# Patient Record
Sex: Female | Born: 1960 | Race: White | Hispanic: No | Smoking: Never smoker
Health system: Southern US, Community
[De-identification: ages and names within clinical notes are randomized; demographics above are authoritative.]

## PROBLEM LIST (undated history)

## (undated) DIAGNOSIS — F32A Depression, unspecified: Secondary | ICD-10-CM

## (undated) DIAGNOSIS — E7211 Homocystinuria: Secondary | ICD-10-CM

## (undated) DIAGNOSIS — T8859XA Other complications of anesthesia, initial encounter: Secondary | ICD-10-CM

## (undated) DIAGNOSIS — E785 Hyperlipidemia, unspecified: Secondary | ICD-10-CM

## (undated) DIAGNOSIS — F329 Major depressive disorder, single episode, unspecified: Secondary | ICD-10-CM

## (undated) DIAGNOSIS — F419 Anxiety disorder, unspecified: Secondary | ICD-10-CM

## (undated) DIAGNOSIS — I10 Essential (primary) hypertension: Secondary | ICD-10-CM

## (undated) DIAGNOSIS — K219 Gastro-esophageal reflux disease without esophagitis: Secondary | ICD-10-CM

## (undated) HISTORY — PX: MENISCUS REPAIR: SHX5179

## (undated) HISTORY — PX: REDUCTION MAMMAPLASTY: SUR839

## (undated) HISTORY — DX: Homocystinuria: E72.11

## (undated) HISTORY — DX: Hyperlipidemia, unspecified: E78.5

## (undated) HISTORY — DX: Major depressive disorder, single episode, unspecified: F32.9

## (undated) HISTORY — DX: Depression, unspecified: F32.A

## (undated) HISTORY — PX: CARPAL TUNNEL RELEASE: SHX101

---

## 1998-05-11 ENCOUNTER — Other Ambulatory Visit: Admission: RE | Admit: 1998-05-11 | Discharge: 1998-05-11 | Payer: Self-pay | Admitting: Obstetrics and Gynecology

## 1998-08-31 ENCOUNTER — Ambulatory Visit (HOSPITAL_COMMUNITY): Admission: RE | Admit: 1998-08-31 | Discharge: 1998-08-31 | Payer: Self-pay | Admitting: Obstetrics and Gynecology

## 1998-08-31 ENCOUNTER — Encounter: Payer: Self-pay | Admitting: Obstetrics and Gynecology

## 1999-01-16 ENCOUNTER — Ambulatory Visit (HOSPITAL_BASED_OUTPATIENT_CLINIC_OR_DEPARTMENT_OTHER): Admission: RE | Admit: 1999-01-16 | Discharge: 1999-01-16 | Payer: Self-pay | Admitting: Orthopedic Surgery

## 1999-05-30 ENCOUNTER — Other Ambulatory Visit: Admission: RE | Admit: 1999-05-30 | Discharge: 1999-05-30 | Payer: Self-pay | Admitting: Obstetrics and Gynecology

## 2000-06-27 ENCOUNTER — Other Ambulatory Visit: Admission: RE | Admit: 2000-06-27 | Discharge: 2000-06-27 | Payer: Self-pay | Admitting: Obstetrics and Gynecology

## 2001-07-17 ENCOUNTER — Other Ambulatory Visit: Admission: RE | Admit: 2001-07-17 | Discharge: 2001-07-17 | Payer: Self-pay | Admitting: Obstetrics and Gynecology

## 2001-10-23 ENCOUNTER — Encounter: Payer: Self-pay | Admitting: Obstetrics and Gynecology

## 2001-10-23 ENCOUNTER — Encounter: Admission: RE | Admit: 2001-10-23 | Discharge: 2001-10-23 | Payer: Self-pay | Admitting: Obstetrics and Gynecology

## 2002-08-18 ENCOUNTER — Other Ambulatory Visit: Admission: RE | Admit: 2002-08-18 | Discharge: 2002-08-18 | Payer: Self-pay | Admitting: Obstetrics and Gynecology

## 2003-09-06 ENCOUNTER — Inpatient Hospital Stay (HOSPITAL_COMMUNITY): Admission: EM | Admit: 2003-09-06 | Discharge: 2003-09-12 | Payer: Self-pay | Admitting: Psychiatry

## 2003-09-13 ENCOUNTER — Other Ambulatory Visit (HOSPITAL_COMMUNITY): Admission: RE | Admit: 2003-09-13 | Discharge: 2003-09-20 | Payer: Self-pay | Admitting: Psychiatry

## 2003-09-22 ENCOUNTER — Other Ambulatory Visit: Admission: RE | Admit: 2003-09-22 | Discharge: 2003-09-22 | Payer: Self-pay | Admitting: Obstetrics and Gynecology

## 2003-09-23 ENCOUNTER — Inpatient Hospital Stay (HOSPITAL_COMMUNITY): Admission: EM | Admit: 2003-09-23 | Discharge: 2003-10-02 | Payer: Self-pay | Admitting: Psychiatry

## 2005-02-23 ENCOUNTER — Other Ambulatory Visit: Admission: RE | Admit: 2005-02-23 | Discharge: 2005-02-23 | Payer: Self-pay | Admitting: Obstetrics and Gynecology

## 2005-03-07 ENCOUNTER — Ambulatory Visit: Payer: Self-pay | Admitting: Internal Medicine

## 2005-08-15 ENCOUNTER — Other Ambulatory Visit: Admission: RE | Admit: 2005-08-15 | Discharge: 2005-08-15 | Payer: Self-pay | Admitting: Obstetrics and Gynecology

## 2007-03-06 ENCOUNTER — Ambulatory Visit: Payer: Self-pay | Admitting: Internal Medicine

## 2007-03-07 ENCOUNTER — Ambulatory Visit: Payer: Self-pay | Admitting: Internal Medicine

## 2007-03-07 LAB — CONVERTED CEMR LAB
ALT: 26 units/L (ref 0–40)
AST: 24 units/L (ref 0–37)
Albumin: 3.8 g/dL (ref 3.5–5.2)
Alkaline Phosphatase: 37 units/L — ABNORMAL LOW (ref 39–117)
BUN: 10 mg/dL (ref 6–23)
Basophils Absolute: 0 10*3/uL (ref 0.0–0.1)
Basophils Relative: 0.1 % (ref 0.0–1.0)
Bilirubin, Direct: 0.1 mg/dL (ref 0.0–0.3)
CO2: 27 meq/L (ref 19–32)
CRP, High Sensitivity: 1 — ABNORMAL HIGH
Calcium: 8.7 mg/dL (ref 8.4–10.5)
Chloride: 108 meq/L (ref 96–112)
Cholesterol: 259 mg/dL (ref 0–200)
Creatinine, Ser: 1 mg/dL (ref 0.4–1.2)
Direct LDL: 157.2 mg/dL
Eosinophils Absolute: 0.5 10*3/uL (ref 0.0–0.6)
Eosinophils Relative: 7.8 % — ABNORMAL HIGH (ref 0.0–5.0)
Free T4: 0.7 ng/dL (ref 0.6–1.6)
GFR calc Af Amer: 78 mL/min
GFR calc non Af Amer: 64 mL/min
Glucose, Bld: 99 mg/dL (ref 70–99)
HCT: 39 % (ref 36.0–46.0)
HDL: 59.5 mg/dL (ref >39.0)
Hemoglobin: 13.8 g/dL (ref 12.0–15.0)
Lymphocytes Relative: 14.8 % (ref 12.0–46.0)
MCHC: 35.3 g/dL (ref 30.0–36.0)
MCV: 92 fL (ref 78.0–100.0)
Monocytes Absolute: 0.5 10*3/uL (ref 0.2–0.7)
Monocytes Relative: 7.3 % (ref 3.0–11.0)
Neutro Abs: 5 10*3/uL (ref 1.4–7.7)
Neutrophils Relative %: 70 % (ref 43.0–77.0)
Platelets: 260 10*3/uL (ref 150–400)
Potassium: 4.1 meq/L (ref 3.5–5.1)
RBC: 4.24 M/uL (ref 3.87–5.11)
RDW: 12.4 % (ref 11.5–14.6)
Sodium: 141 meq/L (ref 135–145)
TSH: 3.16 microintl units/mL (ref 0.35–5.50)
Total Bilirubin: 0.9 mg/dL (ref 0.3–1.2)
Total CHOL/HDL Ratio: 4.4
Total Protein: 6.6 g/dL (ref 6.0–8.3)
Triglycerides: 123 mg/dL (ref 0–149)
VLDL: 25 mg/dL (ref 0–40)
WBC: 7 10*3/uL (ref 4.5–10.5)

## 2007-03-18 ENCOUNTER — Ambulatory Visit: Payer: Self-pay | Admitting: Internal Medicine

## 2007-05-07 ENCOUNTER — Encounter: Payer: Self-pay | Admitting: Internal Medicine

## 2007-05-30 HISTORY — PX: OTHER SURGICAL HISTORY: SHX169

## 2007-06-04 ENCOUNTER — Encounter: Payer: Self-pay | Admitting: Internal Medicine

## 2007-07-17 ENCOUNTER — Encounter: Payer: Self-pay | Admitting: Internal Medicine

## 2007-07-17 HISTORY — PX: OTHER SURGICAL HISTORY: SHX169

## 2008-03-23 ENCOUNTER — Emergency Department (HOSPITAL_COMMUNITY): Admission: EM | Admit: 2008-03-23 | Discharge: 2008-03-23 | Payer: Self-pay | Admitting: Emergency Medicine

## 2008-06-28 ENCOUNTER — Encounter: Payer: Self-pay | Admitting: Internal Medicine

## 2009-08-24 ENCOUNTER — Encounter (INDEPENDENT_AMBULATORY_CARE_PROVIDER_SITE_OTHER): Payer: Self-pay | Admitting: *Deleted

## 2009-10-17 ENCOUNTER — Encounter: Payer: Self-pay | Admitting: Internal Medicine

## 2009-10-18 ENCOUNTER — Encounter: Payer: Self-pay | Admitting: *Deleted

## 2010-02-08 ENCOUNTER — Encounter: Payer: Self-pay | Admitting: Internal Medicine

## 2010-03-21 ENCOUNTER — Telehealth: Payer: Self-pay | Admitting: *Deleted

## 2010-03-24 ENCOUNTER — Ambulatory Visit: Payer: Self-pay | Admitting: Internal Medicine

## 2010-11-16 NOTE — Progress Notes (Signed)
Summary: last tetanus shot  Phone Note Call from Patient Call back at [phone]   Caller: Patient-----live call Reason for Call: Talk to Nurse Summary of Call: when was her last tetanus shot? she thought it was 3 years ago. she is going on a mission trip. Initial call taken by: Warnell Forester,  March 21, 2010 3:05 PM  Follow-up for Phone Call        No record of last td shot in EMR or IDX. Pt aware and is going to come in on friday for inj. Follow-up by: Romualdo Bolk, CMA (AAMA),  March 21, 2010 3:18 PM

## 2010-11-16 NOTE — Letter (Signed)
Summary: [facility]  [facility]   Imported By: Maryln Gottron 02/21/2010 09:29:12  _____________________________________________________________________  External Attachment:    Type:   Image     Comment:   External Document

## 2010-11-16 NOTE — Miscellaneous (Signed)
Summary: Immunization Entry   Immunization History:  Influenza Immunization History:    Influenza:  historical (10/17/2009)

## 2010-11-16 NOTE — Miscellaneous (Signed)
Summary: Flu Vaccine/[facility]  Flu Vaccine/[facility]   Imported By: Maryln Gottron 10/21/2009 09:39:04  _____________________________________________________________________  External Attachment:    Type:   Image     Comment:   External Document

## 2010-11-16 NOTE — Assessment & Plan Note (Signed)
Summary: tdap/ssc  Nurse Visit   Immunizations Administered:  Tetanus Vaccine:    Vaccine Type: Tdap    Site: right deltoid    Mfr: GlaxoSmithKline    Dose: 0.5 ml    Route: IM    Given by: Romualdo Bolk, CMA (AAMA)    Exp. Date: 01/07/2012    Lot #: [id number]  Orders Added: 1)  Tdap => 57yrs IM [90715] 2)  Admin 1st Vaccine [90471]

## 2011-02-12 ENCOUNTER — Telehealth: Payer: Self-pay | Admitting: *Deleted

## 2011-02-12 NOTE — Telephone Encounter (Signed)
Pt is thinking that she has the stomach flu. She is starting to get over the diarrhea and vomiting. Pt has still got a temp 101 around 2:45pm. Pt is taking Advil and wanted some advice about what to take. She started this around 5pm on 4/29. I told pt to drink plenty of fluids, keep hydrated and take tylenol or Advil as directed. Call us if she is not better in a few days.

## 2011-03-02 NOTE — Discharge Summary (Signed)
NAME:  Shannon Gallagher, SHEFFER NO.:  [id number]   MEDICAL RECORD NO.:  [id number]                   PATIENT TYPE:  IPS   LOCATION:  0404                                 [facility]ILITY[facility]   PHYSICIAN:  Jeanice Lim, M.D.              DATE OF BIRTH:  09-24-1961   DATE OF ADMISSION:  09/06/2003  DATE OF DISCHARGE:  09/12/2003                                 DISCHARGE SUMMARY   IDENTIFYING DATA:  This is a 50 year old married Caucasian female  voluntarily admitted with a history of possible OCD versus severe  depression.  The patient apparently had some anxiety, obsessive thinking and  depressive symptoms, which had been mild to moderate for over a year and had  been somewhat isolative but, over the last two weeks, had become more  tearful, more withdrawn, staying in bed, not functioning with thought-  blocking and delusional thoughts.  Apparently, this was associated with  having requested Paxil due to anxiety and depressive symptoms and the  patient became more agitated, stopped sleeping completely.  Had been only  sleeping only 2-3 hours prior and then had been given Wellbutrin from an  OB/GYN doctor and Paxil was stopped and symptoms even worsened.  The patient  had been treated in the past for postpartum depression 7-8 years ago with  Zoloft with a positive response and no side effects.   FAMILY HISTORY:  Mother has a history of OCD and hoarding.   MEDICATIONS:  The patient had taken Paxil for three days and took Wellbutrin  for five days.  Had been on no medications for several years.   PHYSICIAN:  Dr. Vergia Alberts is her physician.   ALLERGIES:  No known drug allergies.   PHYSICAL EXAMINATION:  Essentially within normal limits.  Neurologically  nonfocal.   LABORATORY DATA:  Routine admission labs within normal limits.   MENTAL STATUS EXAM:  Sedated.  Opens eyes when stimulated but unable to  participate in interview.  Unable to assess.  Thought  processes goal  directed.  Thought content negative for dangerous ideation or psychotic  symptoms.  The patient had obsessive ruminations, feeling that she had hurt  her family, that she did not love her family, that she was cold-hearted,  that she was living a lie, that she was financial destroying her family,  using up their insurance, unrealistic, depressive, egosyntonic-related  symptoms as part of a psychotic depression with an obsessive-compulsive  nature.  Thought-blocking at times.  Complaining of difficulty sleeping.  Decreased appetite.  Quite anxious and agitated at times due to distressing  reoccurring thoughts.   ADMISSION DIAGNOSES:   AXIS I:  1. Major depression, severe, recurrent with psychotic symptoms.  2. Obsessive-compulsive disorder.  3. Rule out secondary medication-induced anxiety and mood disorder.   AXIS II:  Deferred.   AXIS III:  History of allergies not otherwise specified.   AXIS IV:  Grief secondary to  divorce of parents and problems with primary  support group.   AXIS V:  23/75.   HOSPITAL COURSE:  The patient was admitted and ordered routine p.r.n.  medications and underwent further monitoring.  Was encouraged to participate  in individual, group and milieu therapy.  The patient was initially given  Zyprexa and Ativan.  EKG was checked due to patient complaint of anxiety and  Xanax ordered p.r.n. for acute anxiety.  The patient reported a positive  response to these medications, was quite distressed about being in the  hospital.  However, her thinking was not rational and communication with  husband, directly from physician as well as meeting with patient and husband  to increased the patient's insight, decrease her distress and to educate  family regarding the importance of treating this condition and the challenge  of treating this condition due to patient irrational thinking, was explained  clearly and patient and husband were comfortable with  medications chosen  after discussing risk/benefit ratio and alternative treatments.  The patient  and family felt patient was distressed in the hospital and that she would do  better in a more supportive setting with 24-hour monitoring and would be  willing to participate in intensive outpatient treatment.  The patient  tolerated medications well.  There was a decrease in anxiety and severity of  depressive symptoms.  There was no dangerous ideation.  However, the patient  still had mild distortions of thinking, obsessive-compulsive nature, again  egosyntonic.  The patient was tolerating medications and discharged to  follow up with IOP with 24-hour family support for 1-2 weeks until  reevaluation by outpatient psychiatrist.   CONDITION ON DISCHARGE:  Improved.  The patient was less anxious, less  severely depressed, in no acute distress.  However still having some thought  distortions, obsessive thoughts and was clearly depressed and anxious.  She  was sleeping and eating well.   DISCHARGE MEDICATIONS:  1. Ativan 0.5 mg twice a day and 1 mg q.h.s.  2. Ambien 10 mg q.h.s.  3. Zoloft 50 mg daily.  4. Risperdal M-Tab 1 mg q.h.s. and 0.5 mg q.h.s. totalling 1.5 mg q.h.s.   FOLLOW UP:  The patient was to follow up in intensive outpatient the  following morning that the IOP was open.   The patient was discharged to family supervision and intensive outpatient  follow-up.   DISCHARGE DIAGNOSES:   AXIS I:  1. Major depression, severe, recurrent with psychotic symptoms.  2. Obsessive-compulsive disorder.  3. Rule out secondary medication-induced anxiety and mood disorder.   AXIS II:  Deferred.   AXIS III:  History of allergies not otherwise specified.   AXIS IV:  Grief secondary to divorce of parents and problems with primary  support group.   AXIS V:  Global Assessment of Functioning on discharge 50.                                               Jeanice Lim,  M.D.   JEM/MEDQ  D:  10/03/2003  T:  10/04/2003  Job:  [id number]

## 2011-03-02 NOTE — Discharge Summary (Signed)
NAME:  Shannon Gallagher, Shannon Gallagher NO.:  [id number]   MEDICAL RECORD NO.:  [id number]                   PATIENT TYPE:  IPS   LOCATION:  0305                                 [facility]ACILITY:  [facility]   PHYSICIAN:  Jeanice Lim, M.D.              DATE OF BIRTH:  20-Nov-1960   DATE OF ADMISSION:  09/23/2003  DATE OF DISCHARGE:  10/02/2003                                 DISCHARGE SUMMARY   IDENTIFYING DATA:  This is a 50 year old married Caucasian female  voluntarily admitted on September 23, 2003.  Presented with a history of  depression and suicidal ideation for two days.  Feeling overwhelmed,  withdrawn.  Decreased sleep and appetite.   MEDICATIONS:  Trazodone 50 mg, Zoloft 50 mg and Risperdal.   ALLERGIES:  No known drug allergies.   PHYSICAL EXAMINATION:  Essentially within normal limits.  Neurologically  nonfocal.   LABORATORY DATA:  Routine admission labs essentially within normal limits.   MENTAL STATUS EXAM:  Alert, middle-aged female.  Casually dressed.  Speech  clear.  Mood tired, depressed.  Affect flat.  Thought processes goal  directed.  Thought content negative for dangerous ideation or psychotic  symptoms.  Judgment and insight were fair.   ADMISSION DIAGNOSES:   AXIS I:  Major depressive disorder, recurrent, severe.   AXIS II:  Deferred.   AXIS III:  None.   AXIS IV:  Moderate (problems with primary support group).   AXIS V:  30/60.   HOSPITAL COURSE:  The patient was admitted and ordered routine p.r.n.  medications and underwent further monitoring.  Was encouraged to participate  in individual, group and milieu therapy.  The patient reported feeling  hopeless, helpless, severely depressed.  Reported having difficulty because  her husband had been out of town.  Tearful, sad, depressed.  Outpatient  psychiatrist saw her as an inpatient and medications were adjusted.  The  patient reported some delusional thinking, suicidal thoughts, anxiety.   She  was optimized on Risperdal, Zoloft and trazodone to restore sleep, target  depressive symptoms and psychotic symptoms.  Reality testing, mood and sleep  all improved as she was stabilized on medications.   CONDITION ON DISCHARGE:  She was discharged in improved condition without  psychotic symptoms, dangerous ideation, reporting motivation to be compliant  with the follow-up plan.   DISCHARGE MEDICATIONS:  1. Zoloft 100 mg in the morning.  2. Zoloft 75 mg q.h.s.  3. Ativan 0.5 mg, 1 in the morning, 1/2 at 1 p.m., 1 at 6 p.m. and 2 q.h.s.  4. Imipramine 100 mg, 2 q.h.s.  5. Risperdal 2 mg q.h.s.  6. Risperdal 0.25 mg, 1/2 at 1 p.m.  7. Cogentin 1 mg twice a day as needed for muscle spasms.   FOLLOW UP:  The patient is to follow up with [facility] starting on October 04, 2003 at 8:30 and then follow  up with Tiana Loft and Milford Cage.   DISCHARGE DIAGNOSES:   AXIS I:  Major depressive disorder, recurrent, severe.   AXIS II:  Deferred.   AXIS III:  None.   AXIS IV:  Moderate (problems with primary support group).   AXIS V:  Global Assessment of Functioning on discharge 55.                                               Jeanice Lim, M.D.    JEM/MEDQ  D:  10/27/2003  T:  10/28/2003  Job:  [id number]

## 2013-01-16 ENCOUNTER — Encounter: Payer: Self-pay | Admitting: Physician Assistant

## 2013-01-21 ENCOUNTER — Encounter: Payer: Self-pay | Admitting: Internal Medicine

## 2013-04-03 ENCOUNTER — Ambulatory Visit: Payer: Self-pay | Admitting: Internal Medicine

## 2013-07-22 ENCOUNTER — Telehealth: Payer: Self-pay | Admitting: Internal Medicine

## 2013-07-22 DIAGNOSIS — E782 Mixed hyperlipidemia: Secondary | ICD-10-CM

## 2013-07-22 DIAGNOSIS — Z79899 Other long term (current) drug therapy: Secondary | ICD-10-CM

## 2013-07-22 NOTE — Telephone Encounter (Signed)
I called pt in regards to scheduling her fu with Dr. Rennis Golden and the pt wanted to know if we received her lab work from her other doctor. She also has an appointment on 10/29 and needs to have blood work done before and would like a lab slip sent to her so she can have this done before her appointment with Dr. Rennis Golden

## 2013-07-22 NOTE — Telephone Encounter (Signed)
No records of labs since 12/2011.left message for her to callback  -when did she last labs and what were they

## 2013-07-23 NOTE — Telephone Encounter (Signed)
Spoke to patient . She states she does not have PCP, and her visit w/GYN  THIS YEAR THEY DID NOT DO LABS.  SHE DID SEE GI doctor, some labs but none for chol.  INFORMED HER THAT SHE CAN TO LAB TO HAVE BLOOD DRAWN FOR HER UPCOMING APPOINTMENT.-----LIVER PROFILE,LIPIDS

## 2013-07-29 LAB — LIPID PANEL
LDL Cholesterol: 219 mg/dL — ABNORMAL HIGH (ref 0–99)
Triglycerides: 183 mg/dL — ABNORMAL HIGH (ref <150)

## 2013-07-29 LAB — HEPATIC FUNCTION PANEL
Alkaline Phosphatase: 56 U/L (ref 39–117)
Indirect Bilirubin: 0.3 mg/dL (ref 0.0–0.9)
Total Bilirubin: 0.4 mg/dL (ref 0.3–1.2)

## 2013-07-30 NOTE — Telephone Encounter (Signed)
LMTCB 07/30/13

## 2013-07-31 ENCOUNTER — Telehealth: Payer: Self-pay | Admitting: Internal Medicine

## 2013-07-31 NOTE — Telephone Encounter (Signed)
Returning phone call  Believes may have been about labwork

## 2013-07-31 NOTE — Telephone Encounter (Signed)
Called patient about lab work - patient verbalized understanding.

## 2013-08-12 ENCOUNTER — Ambulatory Visit (INDEPENDENT_AMBULATORY_CARE_PROVIDER_SITE_OTHER): Payer: 59 | Admitting: Internal Medicine

## 2013-08-12 ENCOUNTER — Encounter: Payer: Self-pay | Admitting: Internal Medicine

## 2013-08-12 VITALS — BP 146/100 | HR 78 | Ht 66.0 in | Wt 178.5 lb

## 2013-08-12 DIAGNOSIS — F4323 Adjustment disorder with mixed anxiety and depressed mood: Secondary | ICD-10-CM

## 2013-08-12 DIAGNOSIS — E785 Hyperlipidemia, unspecified: Secondary | ICD-10-CM

## 2013-08-12 DIAGNOSIS — Z8249 Family history of ischemic heart disease and other diseases of the circulatory system: Secondary | ICD-10-CM

## 2013-08-12 MED ORDER — ROSUVASTATIN CALCIUM 20 MG PO TABS: 40.0000 mg | ORAL_TABLET | Freq: Every day | ORAL | Status: DC

## 2013-08-12 MED ORDER — ROSUVASTATIN CALCIUM 40 MG PO TABS: 40.0000 mg | ORAL_TABLET | Freq: Every day | ORAL | Status: DC

## 2013-08-12 NOTE — Progress Notes (Signed)
OFFICE NOTE  Chief Complaint:  Routine follow-up  Primary Care Physician: No PCP Per Patient  HPI:  Shannon Gallagher is a 52 year old female with a history of depression, family history of coronary disease, and an abnormal [place] study showing a KIF6 positive genotype, as well as a hyperhomocysteinemia. She also had an elevated lipoprotein, Lp(a), and an improved dyslipidemia on Lipitor 80 mg daily. She also takes Wellbutrin and Prozac. She is asymptomatic; however, has gained weight, is up to 184 and laboratory work last year demonstrated total cholesterol of 196 and LDL of 116. As mentioned has significant life stressors and decided after concerns with possible muscle aches and memory issues to discontinue Lipitor. She's been off that for several months and recently had a repeat lipid profile which was significantly abnormal. Her total cholesterol was 314, triglycerides 183, HDL 58 and LDL 219. She remains asymptomatic, however is at significant risk given her abnormal cholesterol profile.  PMHx:  Past Medical History  Diagnosis Date  . Depression   . Dyslipidemia     Positive KIF6 genotype.  Elevated LP(a)  . Hyperhomocysteinemia     Past Surgical History  Procedure Laterality Date  . 2d echo  05/30/2007    EF > 55%, Trace MR.   Marland Kitchen Exercise stress test  07/17/2007    Work load 15 METS.  No CP or EKG changes.    FAMHx:  No family history on file.  SOCHx:   reports that she has never smoked. She has never used smokeless tobacco. She reports that she drinks alcohol. She reports that she does not use illicit drugs.  ALLERGIES:  No Known Allergies  ROS: A comprehensive review of systems was negative except for: Behavioral/Psych: positive for depression and relationship stressors  HOME MEDS: Current Outpatient Prescriptions  Medication Sig Dispense Refill  . buPROPion (WELLBUTRIN XL) 150 MG 24 hr tablet Take 450 mg by mouth daily.       Marland Kitchen FLUoxetine (PROZAC) 40 MG capsule  Take 40 mg by mouth daily.      Marland Kitchen LORazepam (ATIVAN) 1 MG tablet Take 1 mg by mouth every 8 (eight) hours.      Marland Kitchen VYVANSE 70 MG capsule Take 1 capsule by mouth daily.      . rosuvastatin (CRESTOR) 20 MG tablet Take 2 tablets (40 mg total) by mouth daily.  56 tablet  0   No current facility-administered medications for this visit.    LABS/IMAGING: No results found for this or any previous visit (from the past 48 hour(s)). No results found.  VITALS: BP 146/100  Pulse 78  Ht 5\' 6"  (1.676 m)  Wt 178 lb 8 oz (80.967 kg)  BMI 28.82 kg/m2  EXAM: General appearance: alert and no distress Neck: no carotid bruit and no JVD Lungs: clear to auscultation bilaterally Heart: regular rate and rhythm, S1, S2 normal, no murmur, click, rub or gallop Abdomen: soft, non-tender; bowel sounds normal; no masses,  no organomegaly Extremities: extremities normal, atraumatic, no cyanosis or edema Pulses: 2+ and symmetric Skin: Skin color, texture, turgor normal. No rashes or lesions Neurologic: Grossly normal Psych: Tearful, emotional about life stresses  EKG: Normal sinus rhythm at 78  ASSESSMENT: 1. Marked dyslipidemia, possible intolerance to atorvastatin 2. History of depression with significant life stressors 3. Strong family history of coronary disease  PLAN: 1.   Shannon Gallagher is having significant life stress and pressure which is making it difficult for her to take care of herself at this  time. She recently discontinued her Lipitor and definitely demonstrates the genetically abnormal cholesterol profile that she has. She has a markedly elevated LDL and I would recommend treatment without hesitation. Given her concern about atorvastatin, will go ahead and try her on Crestor 40 mg daily. Also asked her to continue to get close counseling and followup with her psychologist and her psychiatrist during this difficult time. Plan to see her back in 6 months after lipid profile.  Chrystie Nose,  MD, [facility] Attending Cardiologist [facility]  Lennette Fader C 08/12/2013, 12:36 PM

## 2013-08-12 NOTE — Patient Instructions (Addendum)
Please have fasting blood work done prior to you office visit in 6 months.  Your physician wants you to follow-up in: 6 months. You will receive a reminder letter in the mail two months in advance. If you don't receive a letter, please call our office to schedule the follow-up appointment.  START crestor 40mg  daily

## 2013-10-27 ENCOUNTER — Other Ambulatory Visit: Payer: Self-pay | Admitting: *Deleted

## 2013-10-27 MED ORDER — ROSUVASTATIN CALCIUM 20 MG PO TABS: 40.0000 mg | ORAL_TABLET | Freq: Every day | ORAL | Status: DC

## 2013-10-27 NOTE — Telephone Encounter (Signed)
PA for crestor 40mg  in process... Samples left at front desk

## 2014-04-07 ENCOUNTER — Other Ambulatory Visit: Payer: Self-pay | Admitting: Obstetrics and Gynecology

## 2014-04-07 DIAGNOSIS — Z803 Family history of malignant neoplasm of breast: Secondary | ICD-10-CM

## 2014-04-14 ENCOUNTER — Ambulatory Visit
Admission: RE | Admit: 2014-04-14 | Discharge: 2014-04-14 | Disposition: A | Discharge status: Home / Self Care | Payer: BC Managed Care – PPO | Source: Ambulatory Visit | Attending: Obstetrics and Gynecology | Admitting: Obstetrics and Gynecology

## 2014-04-14 DIAGNOSIS — Z803 Family history of malignant neoplasm of breast: Secondary | ICD-10-CM

## 2014-04-14 MED ORDER — GADOBENATE DIMEGLUMINE 529 MG/ML IV SOLN
16.0000 mL | Freq: Once | INTRAVENOUS | Status: AC | PRN
  Administered 2014-04-14: 16 mL via INTRAVENOUS

## 2014-04-21 ENCOUNTER — Other Ambulatory Visit (HOSPITAL_COMMUNITY): Payer: Self-pay | Admitting: Obstetrics and Gynecology

## 2014-04-21 ENCOUNTER — Other Ambulatory Visit: Payer: 59

## 2014-04-21 DIAGNOSIS — J984 Other disorders of lung: Secondary | ICD-10-CM

## 2014-04-26 ENCOUNTER — Ambulatory Visit (HOSPITAL_COMMUNITY)
Admission: RE | Admit: 2014-04-26 | Discharge: 2014-04-26 | Disposition: A | Discharge status: Home / Self Care | Payer: BC Managed Care – PPO | Source: Ambulatory Visit | Attending: Obstetrics and Gynecology | Admitting: Obstetrics and Gynecology

## 2014-04-26 ENCOUNTER — Encounter (HOSPITAL_COMMUNITY): Payer: Self-pay

## 2014-04-26 DIAGNOSIS — J9819 Other pulmonary collapse: Secondary | ICD-10-CM | POA: Insufficient documentation

## 2014-04-26 DIAGNOSIS — J984 Other disorders of lung: Secondary | ICD-10-CM | POA: Insufficient documentation

## 2014-04-26 MED ORDER — IOHEXOL 300 MG/ML  SOLN
85.0000 mL | Freq: Once | INTRAMUSCULAR | Status: AC | PRN
  Administered 2014-04-26: 85 mL via INTRAVENOUS

## 2014-07-19 ENCOUNTER — Other Ambulatory Visit: Payer: Self-pay | Admitting: *Deleted

## 2014-07-19 DIAGNOSIS — Z79899 Other long term (current) drug therapy: Secondary | ICD-10-CM

## 2014-07-19 DIAGNOSIS — E785 Hyperlipidemia, unspecified: Secondary | ICD-10-CM

## 2014-07-20 ENCOUNTER — Other Ambulatory Visit: Payer: Self-pay | Admitting: Internal Medicine

## 2014-07-20 LAB — LIPID PANEL
CHOL/HDL RATIO: 3.1 ratio
Cholesterol: 211 mg/dL — ABNORMAL HIGH (ref 0–200)
HDL: 68 mg/dL (ref >39)
LDL Cholesterol: 115 mg/dL — ABNORMAL HIGH (ref 0–99)
TRIGLYCERIDES: 140 mg/dL (ref <150)
VLDL: 28 mg/dL (ref 0–40)

## 2014-07-20 LAB — HEPATIC FUNCTION PANEL
ALBUMIN: 4.5 g/dL (ref 3.5–5.2)
ALK PHOS: 49 U/L (ref 39–117)
ALT: 26 U/L (ref 0–35)
AST: 17 U/L (ref 0–37)
Bilirubin, Direct: <0.1 mg/dL (ref 0.0–0.3)
TOTAL PROTEIN: 7.1 g/dL (ref 6.0–8.3)
Total Bilirubin: 0.2 mg/dL (ref 0.2–1.2)

## 2014-07-22 ENCOUNTER — Ambulatory Visit (INDEPENDENT_AMBULATORY_CARE_PROVIDER_SITE_OTHER): Payer: BC Managed Care – PPO | Admitting: Internal Medicine

## 2014-07-22 ENCOUNTER — Encounter: Payer: Self-pay | Admitting: Internal Medicine

## 2014-07-22 VITALS — BP 122/80 | HR 76 | Ht 66.0 in | Wt 183.1 lb

## 2014-07-22 DIAGNOSIS — E785 Hyperlipidemia, unspecified: Secondary | ICD-10-CM

## 2014-07-22 DIAGNOSIS — F4323 Adjustment disorder with mixed anxiety and depressed mood: Secondary | ICD-10-CM

## 2014-07-22 DIAGNOSIS — Z8249 Family history of ischemic heart disease and other diseases of the circulatory system: Secondary | ICD-10-CM

## 2014-07-22 LAB — NMR LIPOPROFILE WITH LIPIDS
Cholesterol, Total: 229 mg/dL — ABNORMAL HIGH (ref 100–199)
HDL Particle Number: 41.2 umol/L (ref >=30.5)
HDL SIZE: 9.3 nm (ref >=9.2)
HDL-C: 66 mg/dL (ref >39)
LARGE HDL: 10 umol/L (ref >=4.8)
LARGE VLDL-P: 2.7 nmol/L (ref <=2.7)
LDL (calc): 135 mg/dL — ABNORMAL HIGH (ref 0–99)
LDL PARTICLE NUMBER: 1410 nmol/L — AB (ref <1000)
LDL SIZE: 21.5 nm (ref >=20.8)
LP-IR Score: <25 (ref <=45)
SMALL LDL PARTICLE NUMBER: 539 nmol/L — AB (ref <=527)
Triglycerides: 140 mg/dL (ref 0–149)
VLDL Size: 39.1 nm (ref <=46.6)

## 2014-07-22 NOTE — Progress Notes (Signed)
OFFICE NOTE  Chief Complaint:  Routine follow-up  Primary Care Physician: No PCP Per Patient  HPI:  Shannon Gallagher is a 53 year old female with a history of depression, family history of coronary disease, and an abnormal [place] study showing a KIF6 positive genotype, as well as a hyperhomocysteinemia. She also had an elevated lipoprotein, Lp(a), and an improved dyslipidemia on Lipitor 80 mg daily. She also takes Wellbutrin and Prozac. She is asymptomatic; however, has gained weight, is up to 184 and laboratory work last year demonstrated total cholesterol of 196 and LDL of 116. As mentioned has significant life stressors and decided after concerns with possible muscle aches and memory issues to discontinue Lipitor. She's been off that for several months and recently had a repeat lipid profile which was significantly abnormal. Her total cholesterol was 314, triglycerides 183, HDL 58 and LDL 219. She remains asymptomatic, however is at significant risk given her abnormal cholesterol profile.  Shannon Gallagher returns today for followup. She says that this past year has been particularly difficult. Her husband, an Buyer, retailairline pilot had filed for divorce. [place] and it is a difficult time. She's been under significant amount of stress. Her diet has not been as good as it could be. She did have recent lipid profile drawn which shows a total cholesterol of 229, LDL particle number of 1410, LDL calculated 135, HDL calculated 66 and triglycerides 140. This is improved and she is currently however on full dose Crestor 40 mg daily. She says this medication does give her cramps particularly in her hamstrings at night.  Unfortunately her cholesterol is still not at goal based on her strong family history of coronary disease. As she is on maximal tolerated statin therapy, she may be a candidate for a PCSK9 inhibitor.  However, we do not have evidence that she has existing coronary  disease.  PMHx:  Past Medical History  Diagnosis Date  . Depression   . Dyslipidemia     Positive KIF6 genotype.  Elevated LP(a)  . Hyperhomocysteinemia     Past Surgical History  Procedure Laterality Date  . 2d echo  05/30/2007    EF > 55%, Trace MR.   Marland Kitchen. Exercise stress test  07/17/2007    Work load 15 METS.  No CP or EKG changes.    FAMHx:  No family history on file.  SOCHx:   reports that she has never smoked. She has never used smokeless tobacco. She reports that she drinks alcohol. She reports that she does not use illicit drugs.  ALLERGIES:  No Known Allergies  ROS: A comprehensive review of systems was negative except for: Behavioral/Psych: positive for depression and relationship stressors  HOME MEDS: Current Outpatient Prescriptions  Medication Sig Dispense Refill  . buPROPion (WELLBUTRIN XL) 150 MG 24 hr tablet Take 450 mg by mouth daily.       . Esomeprazole Magnesium (NEXIUM 24HR PO) Take 1 capsule by mouth daily.      Marland Kitchen. FLUoxetine (PROZAC) 40 MG capsule Take 40 mg by mouth daily.      Marland Kitchen. LORazepam (ATIVAN) 1 MG tablet Take 1 mg by mouth every 8 (eight) hours as needed.       . rosuvastatin (CRESTOR) 20 MG tablet Take 2 tablets (40 mg total) by mouth daily.  28 tablet  0   No current facility-administered medications for this visit.    LABS/IMAGING: No results found for this or any previous visit (from the  past 48 hour(s)). No results found.  VITALS: BP 122/80  Pulse 76  Ht 5\' 6"  (1.676 m)  Wt 183 lb 1.6 oz (83.054 kg)  BMI 29.57 kg/m2  EXAM: General appearance: alert and no distress Neck: no carotid bruit and no JVD Lungs: clear to auscultation bilaterally Heart: regular rate and rhythm, S1, S2 normal, no murmur, click, rub or gallop Abdomen: soft, non-tender; bowel sounds normal; no masses,  no organomegaly Extremities: extremities normal, atraumatic, no cyanosis or edema Pulses: 2+ and symmetric Skin: Skin color, texture, turgor normal. No  rashes or lesions Neurologic: Grossly normal Psych: Tearful, emotional about life stresses  EKG: Normal sinus rhythm at 76  ASSESSMENT: 1. Marked dyslipidemia, intolerance to atorvastatin and Crestor 2. History of depression with significant life stressors 3. Strong family history of coronary disease  PLAN: 1.   Shannon Gallagher has had a difficult year and is under significant stress. Her cholesterol profile is better however she is having side effects from Crestor but puts up with it because of her risks. We did discuss the new PCSK9 inhibitors that are available. She may be a candidate for this. I would like to further risk stratify her with a coronary CT calcium score to demonstrate that she may have some premature coronary artery disease. She does have abnormal coronary calcium then I would suspect she would qualify for PCSK9 inhibitor therapy. She will try a short statin holiday to see if her symptoms improve off of the Crestor. She may want to go back on atorvastatin because it was less bothersome than the Crestor is.  Plan to see her back annually.  Chrystie Nose, MD, [facility] Attending Cardiologist [facility]  Phuoc Huy C 07/22/2014, 4:36 PM

## 2014-07-22 NOTE — Patient Instructions (Addendum)
STOP crestor for 2 weeks.   Dr. Rennis GoldenHilty has ordered a CT calcium score - done [address]   Your physician wants you to follow-up in: 1 year. You will receive a reminder letter in the mail two months in advance. If you don't receive a letter, please call our office to schedule the follow-up appointment.

## 2014-07-28 ENCOUNTER — Ambulatory Visit (INDEPENDENT_AMBULATORY_CARE_PROVIDER_SITE_OTHER)
Admission: RE | Admit: 2014-07-28 | Discharge: 2014-07-28 | Disposition: A | Discharge status: Home / Self Care | Payer: Self-pay | Source: Ambulatory Visit | Attending: Internal Medicine | Admitting: Internal Medicine

## 2014-07-28 DIAGNOSIS — Z8249 Family history of ischemic heart disease and other diseases of the circulatory system: Secondary | ICD-10-CM

## 2014-07-28 DIAGNOSIS — E785 Hyperlipidemia, unspecified: Secondary | ICD-10-CM

## 2014-07-30 ENCOUNTER — Other Ambulatory Visit: Payer: Self-pay | Admitting: *Deleted

## 2014-07-30 MED ORDER — ATORVASTATIN CALCIUM 80 MG PO TABS: 80.0000 mg | ORAL_TABLET | Freq: Every day | ORAL | Status: DC

## 2014-08-02 ENCOUNTER — Other Ambulatory Visit: Payer: Self-pay | Admitting: *Deleted

## 2014-08-02 DIAGNOSIS — E785 Hyperlipidemia, unspecified: Secondary | ICD-10-CM

## 2014-08-02 NOTE — Progress Notes (Signed)
Lab slip mailed to patient - repeat lipid in 3 months (approx Jan 2016)

## 2014-10-26 ENCOUNTER — Other Ambulatory Visit: Payer: Self-pay | Admitting: Allergy and Immunology

## 2014-10-26 ENCOUNTER — Ambulatory Visit
Admission: RE | Admit: 2014-10-26 | Discharge: 2014-10-26 | Disposition: A | Discharge status: Home / Self Care | Payer: Self-pay | Source: Ambulatory Visit | Attending: Allergy and Immunology | Admitting: Allergy and Immunology

## 2014-10-26 DIAGNOSIS — R079 Chest pain, unspecified: Secondary | ICD-10-CM

## 2015-11-21 ENCOUNTER — Other Ambulatory Visit: Payer: Self-pay | Admitting: Internal Medicine

## 2015-11-23 ENCOUNTER — Other Ambulatory Visit: Payer: Self-pay | Admitting: Internal Medicine

## 2015-11-24 NOTE — Telephone Encounter (Signed)
REFILL 

## 2016-06-11 ENCOUNTER — Other Ambulatory Visit: Payer: Self-pay | Admitting: Obstetrics and Gynecology

## 2016-06-11 DIAGNOSIS — Z803 Family history of malignant neoplasm of breast: Secondary | ICD-10-CM

## 2016-06-25 ENCOUNTER — Inpatient Hospital Stay: Admission: RE | Admit: 2016-06-25 | Payer: BLUE CROSS/BLUE SHIELD | Source: Ambulatory Visit

## 2016-07-05 ENCOUNTER — Ambulatory Visit
Admission: RE | Admit: 2016-07-05 | Discharge: 2016-07-05 | Disposition: A | Discharge status: Home / Self Care | Payer: BLUE CROSS/BLUE SHIELD | Source: Ambulatory Visit | Attending: Obstetrics and Gynecology | Admitting: Obstetrics and Gynecology

## 2016-07-05 DIAGNOSIS — Z803 Family history of malignant neoplasm of breast: Secondary | ICD-10-CM

## 2016-07-05 MED ORDER — GADOBENATE DIMEGLUMINE 529 MG/ML IV SOLN
15.0000 mL | Freq: Once | INTRAVENOUS | Status: AC | PRN
  Administered 2016-07-05: 15 mL via INTRAVENOUS

## 2016-12-05 ENCOUNTER — Telehealth: Payer: Self-pay | Admitting: Internal Medicine

## 2016-12-05 DIAGNOSIS — Z8249 Family history of ischemic heart disease and other diseases of the circulatory system: Secondary | ICD-10-CM

## 2016-12-05 DIAGNOSIS — Z79899 Other long term (current) drug therapy: Secondary | ICD-10-CM

## 2016-12-05 DIAGNOSIS — E785 Hyperlipidemia, unspecified: Secondary | ICD-10-CM

## 2016-12-05 NOTE — Telephone Encounter (Signed)
Spoke to patient  patient states no labs since 2015 . Instructed patient to have lab CMP ,LIPID drawn Thursday or Friday for result to be available.  address given and phone number Patient verbalized understanding.

## 2016-12-05 NOTE — Telephone Encounter (Signed)
Pt is scheduled to see Dr Rennis GoldenHilty on Monday. She have not seen him since 2015,she wants to know if she will need lab work before her appointment?

## 2016-12-06 LAB — COMPREHENSIVE METABOLIC PANEL
ALK PHOS: 49 U/L (ref 33–130)
ALT: 19 U/L (ref 6–29)
AST: 17 U/L (ref 10–35)
Albumin: 4.2 g/dL (ref 3.6–5.1)
BILIRUBIN TOTAL: 0.3 mg/dL (ref 0.2–1.2)
BUN: 12 mg/dL (ref 7–25)
CALCIUM: 9.2 mg/dL (ref 8.6–10.4)
CO2: 24 mmol/L (ref 20–31)
CREATININE: 0.86 mg/dL (ref 0.50–1.05)
Chloride: 107 mmol/L (ref 98–110)
GLUCOSE: 102 mg/dL — AB (ref 65–99)
Potassium: 4.4 mmol/L (ref 3.5–5.3)
Sodium: 140 mmol/L (ref 135–146)
Total Protein: 6.9 g/dL (ref 6.1–8.1)

## 2016-12-06 LAB — LIPID PANEL
CHOLESTEROL: 184 mg/dL (ref <200)
HDL: 66 mg/dL (ref >50)
LDL Cholesterol: 101 mg/dL — ABNORMAL HIGH (ref <100)
Total CHOL/HDL Ratio: 2.8 Ratio (ref <5.0)
Triglycerides: 85 mg/dL (ref <150)
VLDL: 17 mg/dL (ref <30)

## 2016-12-10 ENCOUNTER — Encounter (INDEPENDENT_AMBULATORY_CARE_PROVIDER_SITE_OTHER): Payer: Self-pay

## 2016-12-10 ENCOUNTER — Encounter: Payer: Self-pay | Admitting: Internal Medicine

## 2016-12-10 ENCOUNTER — Ambulatory Visit (INDEPENDENT_AMBULATORY_CARE_PROVIDER_SITE_OTHER): Payer: BLUE CROSS/BLUE SHIELD | Admitting: Internal Medicine

## 2016-12-10 VITALS — BP 126/88 | HR 61 | Ht 66.0 in | Wt 182.6 lb

## 2016-12-10 DIAGNOSIS — E785 Hyperlipidemia, unspecified: Secondary | ICD-10-CM | POA: Diagnosis not present

## 2016-12-10 DIAGNOSIS — R002 Palpitations: Secondary | ICD-10-CM | POA: Diagnosis not present

## 2016-12-10 DIAGNOSIS — Z8249 Family history of ischemic heart disease and other diseases of the circulatory system: Secondary | ICD-10-CM

## 2016-12-10 NOTE — Patient Instructions (Addendum)
Your physician wants you to follow-up in: ONE YEAR with Dr. Hilty. You will receive a reminder letter in the mail two months in advance. If you don't receive a letter, please call our office to schedule the follow-up appointment.  

## 2016-12-10 NOTE — Progress Notes (Signed)
OFFICE NOTE  Chief Complaint:  Palpitations, anxiety  Primary Care Physician: No PCP Per Patient  HPI:  Shannon Gallagher is a 56 year old female with a history of depression, family history of coronary disease, and an abnormal [place] study showing a KIF6 positive genotype, as well as a hyperhomocysteinemia. She also had an elevated lipoprotein, Lp(a), and an improved dyslipidemia on Lipitor 80 mg daily. She also takes Wellbutrin and Prozac. She is asymptomatic; however, has gained weight, is up to 184 and laboratory work last year demonstrated total cholesterol of 196 and LDL of 116. As mentioned has significant life stressors and decided after concerns with possible muscle aches and memory issues to discontinue Lipitor. She's been off that for several months and recently had a repeat lipid profile which was significantly abnormal. Her total cholesterol was 314, triglycerides 183, HDL 58 and LDL 219. She remains asymptomatic, however is at significant risk given her abnormal cholesterol profile.  Shannon Gallagher returns today for followup. She says that this past year has been particularly difficult. Her husband, an Buyer, retail had filed for divorce. She has 2 children at [facility] state and it is a difficult time. She's been under significant amount of stress. Her diet has not been as good as it could be. She did have recent lipid profile drawn which shows a total cholesterol of 229, LDL particle number of 1410, LDL calculated 135, HDL calculated 66 and triglycerides 140. This is improved and she is currently however on full dose Crestor 40 mg daily. She says this medication does give her cramps particularly in her hamstrings at night.  Unfortunately her cholesterol is still not at goal based on her strong family history of coronary disease. As she is on maximal tolerated statin therapy, she may be a candidate for a PCSK9 inhibitor.  However, we do not have evidence that she has existing coronary  disease.  12/10/2016  Shannon Gallagher returns today for follow-up. It's been about 2-1/2 years since I saw her in the office. She returns today for follow-up and is complaining of palpitations. At the time she had filed for divorce but however this is not totally settled and there is an [place] question that remains. She is under significant stress from this. She's recently been having some more palpitations. She does see a psychiatrist, Dr. 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 She also likely has ADD and recently started [facility] for treatment of that. In addition, she was given some Xanax for anxiety. She reports her palpitations are worse particularly when getting bad news, communications from her ex-husband or communications from the attorney. She said they tend to be very short-lived. Sometimes Xanax actually helps improve the palpitations. The frequency of these palpitations have interestingly increased somewhat by my observation after starting on ADD medication. She denies any chest pain. As a reminder she underwent a coronary artery calcium score in 2015 which showed 0 coronary calcium. Despite this she had somewhat of an elevated lipid profile and a strong family history of premature coronary disease in her father. She was intolerant of statins but was ultimately placed on atorvastatin 80 mg and has had a marked improvement in her lipid profile. Her recent lipid profile from 222/18 showed total cholesterol 184, HDL-C 66, LDL-C101, triglycerides 85, non-HDL cholesterol of 118. This is on therapy. According to current guidelines, one could argue given her 0 coronary calcium score her risk is very low and that statin  therapy may not even be indicated, although her cholesterol is expected to be much higher based on her response to high potency statin therapy. I did calculate a Mesa risk score. Given her traditional risk factors her 10 year risk of  CHD event is only 2%, and given her 0 calcium score at risk lowered to 1.4% which is similar to the general population given her age.  PMHx:  Past Medical History:  Diagnosis Date  . Depression   . Dyslipidemia    Positive KIF6 genotype.  Elevated LP(a)  . Hyperhomocysteinemia (HCC)     Past Surgical History:  Procedure Laterality Date  . 2D Echo  05/30/2007   EF > 55%, Trace MR.   Marland Kitchen. Exercise stress test  07/17/2007   Work load 15 METS.  No CP or EKG changes.    FAMHx:  No family history on file.  SOCHx:   reports that she has never smoked. She has never used smokeless tobacco. She reports that she drinks alcohol. She reports that she does not use drugs.  ALLERGIES:  No Known Allergies  ROS: Pertinent items noted in HPI and remainder of comprehensive ROS otherwise negative.  HOME MEDS: Current Outpatient Prescriptions  Medication Sig Dispense Refill  . atorvastatin (LIPITOR) 80 MG tablet TAKE 1 TABLET DAILY. 90 tablet 3  . buPROPion (WELLBUTRIN XL) 150 MG 24 hr tablet Take 450 mg by mouth daily.     . Esomeprazole Magnesium (NEXIUM 24HR PO) Take 1 capsule by mouth daily.    Marland Kitchen. FLUoxetine (PROZAC) 40 MG capsule Take 40 mg by mouth daily.    Marland Kitchen. LORazepam (ATIVAN) 1 MG tablet Take 1 mg by mouth every 8 (eight) hours as needed.     Marland Kitchen. VYVANSE 70 MG capsule Take 1 capsule by mouth daily.  0   No current facility-administered medications for this visit.     LABS/IMAGING: No results found for this or any previous visit (from the past 48 hour(s)). No results found.  VITALS: BP 126/88   Pulse 61   Ht 5\' 6"  (1.676 m)   Wt 182 lb 9.6 oz (82.8 kg)   BMI 29.47 kg/m   EXAM: General appearance: alert and no distress Neck: no carotid bruit and no JVD Lungs: clear to auscultation bilaterally Heart: regular rate and rhythm, S1, S2 normal, no murmur, click, rub or gallop Abdomen: soft, non-tender; bowel sounds normal; no masses,  no organomegaly Extremities: extremities normal,  atraumatic, no cyanosis or edema Pulses: 2+ and symmetric Skin: Skin color, texture, turgor normal. No rashes or lesions Neurologic: Grossly normal Psych: Tearful, emotional about life stresses  EKG: Normal sinus rhythm at 61  ASSESSMENT: 1. Palpitations - likely related to stress/anxiety, possibly worse on Vyvanase 2. Dyslipidemia - improved on Lipitor 80 mg 3. CAC 0 - (2015) 4. History of depression with significant life stressors 5. Strong family history of coronary disease  PLAN: 1.   Mrs. Mayford KnifeWilliams continues to have significant stress as her divorce is being finalized. Her palpitations are likely related to stress and anxiety and may be worsened on her ADD medication. I asked her to discuss with her psychiatrist to see if there is an alternative or to see how she may do off of the medication. Of course if her stress improved somewhat after her final alimony settlement, the symptoms may improve. If they persist or worsen, we could consider monitoring but given her 0 coronary calcium score 2015 and adequate treatment of cholesterol, her risk of an ischemic  cause of these palpitations is extremely low. We did discuss her low Mesa score and the fact that many people would suggest that she may not even need to be on cholesterol treatment given her negative coronary calcium, however there is a small possibility she could have noncalcified plaque and as her cholesterol profile is improved on a statin, her numbers would be much higher without it. I think in 10 years from now or longer she may be at risk for developing clinically significant coronary disease. Since she is tolerant of Lipitor without the side effects she previously had on Crestor, I would recommend we continue with that medication.  Plan to see her back annually or sooner if monitor is necessary.  Chrystie Nose, MD, [facility] Attending Cardiologist [facility]  Lisette Abu Vyctoria Dickman 12/10/2016, 11:05 AM

## 2017-01-03 ENCOUNTER — Other Ambulatory Visit: Payer: Self-pay | Admitting: Internal Medicine

## 2017-10-10 ENCOUNTER — Other Ambulatory Visit: Payer: Self-pay | Admitting: *Deleted

## 2017-10-10 DIAGNOSIS — E785 Hyperlipidemia, unspecified: Secondary | ICD-10-CM

## 2017-10-15 ENCOUNTER — Ambulatory Visit (INDEPENDENT_AMBULATORY_CARE_PROVIDER_SITE_OTHER): Payer: BLUE CROSS/BLUE SHIELD

## 2017-10-15 ENCOUNTER — Other Ambulatory Visit: Payer: Self-pay

## 2017-10-15 ENCOUNTER — Ambulatory Visit (HOSPITAL_COMMUNITY)
Admission: EM | Admit: 2017-10-15 | Discharge: 2017-10-15 | Disposition: A | Discharge status: Home / Self Care | Payer: BLUE CROSS/BLUE SHIELD | Attending: Physician Assistant | Admitting: Physician Assistant

## 2017-10-15 ENCOUNTER — Encounter (HOSPITAL_COMMUNITY): Payer: Self-pay | Admitting: Emergency Medicine

## 2017-10-15 DIAGNOSIS — R509 Fever, unspecified: Secondary | ICD-10-CM | POA: Diagnosis not present

## 2017-10-15 DIAGNOSIS — R05 Cough: Secondary | ICD-10-CM | POA: Diagnosis not present

## 2017-10-15 DIAGNOSIS — R059 Cough, unspecified: Secondary | ICD-10-CM

## 2017-10-15 MED ORDER — DOXYCYCLINE HYCLATE 100 MG PO CAPS: 100.0000 mg | ORAL_CAPSULE | Freq: Two times a day (BID) | ORAL | 0 refills | Status: DC

## 2017-10-15 MED ORDER — DOXYCYCLINE HYCLATE 100 MG PO CAPS: 100.0000 mg | ORAL_CAPSULE | Freq: Two times a day (BID) | ORAL | 0 refills | Status: AC

## 2017-10-15 MED ORDER — HYDROCODONE-HOMATROPINE 5-1.5 MG/5ML PO SYRP: 2.5000 mL | ORAL_SOLUTION | Freq: Every day | ORAL | 0 refills | Status: AC

## 2017-10-15 NOTE — ED Provider Notes (Addendum)
10/15/2017 12:56 PM   DOB: 12-Sep-1961 / MRN: [id number]  SUBJECTIVE:  Shannon Gallagher is a 57 y.o. female presenting for cough. Viral symptoms since the 12/17 with double sickening over last 24 hours. Blood speckled sputum. No leg pain or leg swelling.  No estrogen products, non smoker, no history of dvt.   She has No Known Allergies.   She  has a past medical history of Depression, Dyslipidemia, and Hyperhomocysteinemia (HCC).    She  reports that  has never smoked. she has never used smokeless tobacco. She reports that she drinks alcohol. She reports that she does not use drugs. She  has no sexual activity history on file. The patient  has a past surgical history that includes 2D Echo (05/30/2007) and Exercise stress test (07/17/2007).  Her family history is not on file.  Review of Systems  Constitutional: Positive for chills, fever and malaise/fatigue. Negative for diaphoresis.  HENT: Positive for sore throat.   Respiratory: Positive for cough, hemoptysis and sputum production. Negative for shortness of breath and wheezing.   Cardiovascular: Negative for chest pain and leg swelling.  Gastrointestinal: Negative for nausea.  Musculoskeletal: Negative for myalgias.  Skin: Negative for itching and rash.  Neurological: Negative for dizziness and headaches.    OBJECTIVE:  BP 138/82 (BP Location: Left Arm)   Pulse 80   Temp 99.1 F (37.3 C) (Oral)   Resp 20   SpO2 98%   Physical Exam  Constitutional: She is active.  Non-toxic appearance.  HENT:  Right Ear: Hearing, tympanic membrane, external ear and ear canal normal.  Left Ear: Hearing, tympanic membrane, external ear and ear canal normal.  Nose: Nose normal. Right sinus exhibits no maxillary sinus tenderness and no frontal sinus tenderness. Left sinus exhibits no maxillary sinus tenderness and no frontal sinus tenderness.  Mouth/Throat: Uvula is midline, oropharynx is clear and moist and mucous membranes are normal. Mucous membranes  are not dry. No oropharyngeal exudate, posterior oropharyngeal edema or tonsillar abscesses.  Cardiovascular: Normal rate, regular rhythm, S1 normal, S2 normal, normal heart sounds and intact distal pulses. Exam reveals no gallop, no friction rub and no decreased pulses.  No murmur heard. Pulmonary/Chest: Effort normal and breath sounds normal. No stridor. No tachypnea. No respiratory distress. She has no wheezes. She has no rales. She exhibits no tenderness.  Abdominal: She exhibits no distension.  Musculoskeletal: She exhibits no edema.  Lymphadenopathy:       Head (right side): No submandibular and no tonsillar adenopathy present.       Head (left side): No submandibular and no tonsillar adenopathy present.    She has no cervical adenopathy.  Neurological: She is alert.  Skin: Skin is warm and dry. She is not diaphoretic. No pallor.    No results found for this or any previous visit (from the past 72 hour(s)).  Dg Chest 2 View  Result Date: 10/15/2017 CLINICAL DATA:  Three-week history of cough. EXAM: CHEST  2 VIEW COMPARISON:  10/26/2014 FINDINGS: The heart size and mediastinal contours are within normal limits. Both lungs are clear. The visualized skeletal structures are unremarkable. IMPRESSION: No active cardiopulmonary disease. Electronically Signed   By: Kennith CenterEric  Mansell M.D.   On: 10/15/2017 12:48    ASSESSMENT AND PLAN:  The primary encounter diagnosis was Cough. A diagnosis of Fever, unspecified fever cause was also pertinent to this visit. Given mild fever and double sickening will cover for an atypical pneumonia.  Hycodan for comfort. RTC as needed.  The patient is advised to call or return to clinic if she does not see an improvement in symptoms, or to seek the care of the closest emergency department if she worsens with the above plan.   Deliah Boston, MHS, PA-C 10/15/2017 12:56 PM    Ofilia Neas, PA-C 10/15/17 1259    Ofilia Neas, PA-C 10/15/17 [phone]

## 2017-10-15 NOTE — Discharge Instructions (Signed)
Take 600 mg of Iburpofen every 8 hours.  Use cough syrup as needed at bedtime for cough.  Start the doxycycline. Come back if needed.

## 2017-10-15 NOTE — ED Triage Notes (Signed)
Pt c/o cold symptoms for several weeks, cough, URI, taking dayquil niquil. Pt also c/o acid relux and shes out of her reflux medicine. Pt states last night she coughed up phlegm with blood in it.

## 2017-11-20 ENCOUNTER — Ambulatory Visit: Payer: Self-pay | Admitting: Podiatry

## 2017-11-21 ENCOUNTER — Other Ambulatory Visit: Payer: Self-pay | Admitting: Physician Assistant

## 2017-11-21 ENCOUNTER — Ambulatory Visit (HOSPITAL_COMMUNITY)
Admission: RE | Admit: 2017-11-21 | Discharge: 2017-11-21 | Disposition: A | Discharge status: Home / Self Care | Payer: BLUE CROSS/BLUE SHIELD | Source: Ambulatory Visit | Attending: Physician Assistant | Admitting: Physician Assistant

## 2017-11-21 DIAGNOSIS — M79604 Pain in right leg: Secondary | ICD-10-CM

## 2017-11-21 NOTE — Progress Notes (Signed)
Right lower extremity venous duplex completed. No evidence of DVT or superficial thrombosis. There ia an area of mixed echoes in the popliteal fosse measuring 4.26 cm x 2.04 cm consistent with a Baker's cyst. Shannon DeitersVirginia Dayana Gallagher, RVS 11/21/2017 5:41 PM

## 2017-11-22 ENCOUNTER — Encounter (HOSPITAL_COMMUNITY): Payer: BLUE CROSS/BLUE SHIELD

## 2018-01-09 ENCOUNTER — Ambulatory Visit: Payer: Self-pay | Admitting: Podiatry

## 2018-01-16 ENCOUNTER — Ambulatory Visit (INDEPENDENT_AMBULATORY_CARE_PROVIDER_SITE_OTHER): Payer: BLUE CROSS/BLUE SHIELD

## 2018-01-16 ENCOUNTER — Encounter: Payer: Self-pay | Admitting: Podiatry

## 2018-01-16 ENCOUNTER — Ambulatory Visit (INDEPENDENT_AMBULATORY_CARE_PROVIDER_SITE_OTHER): Payer: BLUE CROSS/BLUE SHIELD | Admitting: Podiatry

## 2018-01-16 DIAGNOSIS — M2040 Other hammer toe(s) (acquired), unspecified foot: Secondary | ICD-10-CM

## 2018-01-16 DIAGNOSIS — M21619 Bunion of unspecified foot: Secondary | ICD-10-CM

## 2018-01-16 NOTE — Patient Instructions (Signed)
Bunion A bunion is a bump on the base of the big toe that forms when the bones of the big toe joint move out of position. Bunions may be small at first, but they often get larger over time. The can make walking painful. What are the causes? A bunion may be caused by:  Wearing narrow or pointed shoes that force the big toe to press against the other toes.  Abnormal foot development that causes the foot to roll inward (pronate).  Changes in the foot that are caused by certain diseases, such as rheumatoid arthritis and polio.  A foot injury.  What increases the risk? The following factors may make you more likely to develop this condition:  Wearing shoes that squeeze the toes together.  Having certain diseases, such as: ? Rheumatoid arthritis. ? Polio. ? Cerebral palsy.  Having family members who have bunions.  Being born with a foot deformity, such as flat feet or low arches.  Doing activities that put a lot of pressure on the feet, such as ballet dancing.  What are the signs or symptoms? The main symptom of a bunion is a noticeable bump on the big toe. Other symptoms may include:  Pain.  Swelling around the big toe.  Redness and inflammation.  Thick or hardened skin on the big toe or between the toes.  Stiffness or loss of motion in the big toe.  Trouble with walking.  How is this diagnosed? A bunion may be diagnosed based on your symptoms, medical history, and activities. You may have tests, such as:  X-rays. These allow your health care provider to check the position of the bones in your foot and look for damage to your joint. They also help your health care provider to determine the severity of your bunion and the best way to treat it.  Joint aspiration. In this test, a sample of fluid is removed from the toe joint. This test, which may be done if you are in a lot of pain, helps to rule out diseases that cause painful swelling of the joints, such as  arthritis.  How is this treated? There is no cure for a bunion, but treatment can help to prevent a bunion from getting worse. Treatment depends on the severity of your symptoms. Your health care provider may recommend:  Wearing shoes that have a wide toe box.  Using bunion pads to cushion the affected area.  Taping your toes together to keep them in a normal position.  Placing a device inside your shoe (orthotics) to help reduce pressure on your toe joint.  Taking medicine to ease pain, inflammation, and swelling.  Applying heat or ice to the affected area.  Doing stretching exercises.  Surgery to remove scar tissue and move the toes back into their normal position. This treatment is rare.  Follow these instructions at home:  Support your toe joint with proper footwear, shoe padding, or taping as told by your health care provider.  Take over-the-counter and prescription medicines only as told by your health care provider.  If directed, apply ice to the injured area: ? Put ice in a plastic bag. ? Place a towel between your skin and the bag. ? Leave the ice on for 20 minutes, 2-3 times per day.  If directed, apply heat to the affected area before you exercise. Use the heat source that your health care provider recommends, such as a moist heat pack or a heating pad. ? Place a towel between your   skin and the heat source. ? Leave the heat on for 20-30 minutes. ? Remove the heat if your skin turns bright red. This is especially important if you are unable to feel pain, heat, or cold. You may have a greater risk of getting burned.  Do exercises as told by your health care provider.  Keep all follow-up visits as told by your health care provider. Contact a health care provider if:  Your symptoms get worse.  Your symptoms do not improve in 2 weeks. Get help right away if:  You have severe pain and trouble with walking. This information is not intended to replace advice given  to you by your health care provider. Make sure you discuss any questions you have with your health care provider. Document Released: 10/01/2005 Document Revised: 03/08/2016 Document Reviewed: 05/01/2015 Elsevier Interactive Patient Education  2018 Elsevier Inc.   Hammer Toe Hammer toe is a change in the shape (a deformity) of your second, third, or fourth toe. The deformity causes the middle joint of your toe to stay bent. This causes pain, especially when you are wearing shoes. Hammer toe starts gradually. At first, the toe can be straightened. Gradually over time, the deformity becomes stiff and permanent. Early treatments to keep the toe straight may relieve pain. As the deformity becomes stiff and permanent, surgery may be needed to straighten the toe. What are the causes? Hammer toe is caused by abnormal bending of the toe joint that is closest to your foot. It happens gradually over time. This pulls on the muscles and connections (tendons) of the toe joint, making them weak and stiff. It is often related to wearing shoes that are too short or narrow and do not let your toes straighten. What increases the risk? You may be at greater risk for hammer toe if you:  Are female.  Are older.  Wear shoes that are too small.  Wear high-heeled shoes that pinch your toes.  Are a ballet dancer.  Have a second toe that is longer than your big toe (first toe).  Injure your foot or toe.  Have arthritis.  Have a family history of hammer toe.  Have a nerve or muscle disorder.  What are the signs or symptoms? The main symptoms of this condition are pain and deformity of the toe. The pain is worse when wearing shoes, walking, or running. Other symptoms may include:  Corns or calluses over the bent part of the toe or between the toes.  Redness and a burning feeling on the toe.  An open sore that forms on the top of the toe.  Not being able to straighten the toe.  How is this  diagnosed? This condition is diagnosed based on your symptoms and a physical exam. During the exam, your health care provider will try to straighten your toe to see how stiff the deformity is. You may also have tests, such as:  A blood test to check for rheumatoid arthritis.  An X-ray to show how severe the deformity is.  How is this treated? Treatment for this condition will depend on how stiff the deformity is. Surgery is often needed. However, sometimes a hammer toe can be straightened without surgery. Treatments that do not involve surgery include:  Taping the toe into a straightened position.  Using pads and cushions to protect the toe (orthotics).  Wearing shoes that provide enough room for the toes.  Doing toe-stretching exercises at home.  Taking an NSAID to reduce pain and   swelling.  If these treatments do not help or the toe cannot be straightened, surgery is the next option. The most common surgeries used to straighten a hammer toe include:  Arthroplasty. In this procedure, part of the joint is removed, and that allows the toe to straighten.  Fusion. In this procedure, cartilage between the two bones of the joint is taken out and the bones are fused together into one longer bone.  Implantation. In this procedure, part of the bone is removed and replaced with an implant to let the toe move again.  Flexor tendon transfer. In this procedure, the tendons that curl the toes down (flexor tendons) are repositioned.  Follow these instructions at home:  Take over-the-counter and prescription medicines only as told by your health care provider.  Do toe straightening and stretching exercises as told by your health care provider.  Keep all follow-up visits as told by your health care provider. This is important. How is this prevented?  Wear shoes that give your toes enough room and do not cause pain.  Do not wear high-heeled shoes. Contact a health care provider if:  Your  pain gets worse.  Your toe becomes red or swollen.  You develop an open sore on your toe. This information is not intended to replace advice given to you by your health care provider. Make sure you discuss any questions you have with your health care provider. Document Released: 09/28/2000 Document Revised: 04/20/2016 Document Reviewed: 01/25/2016 Elsevier Interactive Patient Education  2018 Elsevier Inc.   

## 2018-01-16 NOTE — Progress Notes (Signed)
Subjective:   Patient ID: Shannon Gallagher, female   DOB: 57 y.o.   MRN: [id number]   HPI Patient presents stating her second toe has lifted in the air over the last 6 months on the left foot and is becoming increasingly symptomatic and she does have bunion deformities that is been present for a long time bilateral with it seems the left being worse and irritation between the big toe second toe of the right foot.  Also complains of the fifth toe of the left foot.  Patient does not smoke and likes to be at   Review of Systems  All other systems reviewed and are negative.       Objective:  Physical Exam  Constitutional: She appears well-developed and well-nourished.  Cardiovascular: Intact distal pulses.  Pulmonary/Chest: Effort normal.  Musculoskeletal: Normal range of motion.  Neurological: She is alert.  Skin: Skin is warm.  Nursing note and vitals reviewed.   Neurovascular status found to be intact muscle strength is adequate range of motion within normal limits with patient found to have structural bunion deformity left over right with deviation of the hallux against the second toe and moderate rigid contracture digit to left short-term nature along with keratotic lesion and pain fifth digit left foot.  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  I have recommended bunionectomy left in order to help realign the first metatarsal and moderately straighten the big toe digital fusion digit to left and arthroplasty digit fifth left.  Patient wants to have this done we will probably wait till later in the year and I did give her a overview of what would be required for surgery.  Patient will schedule for  these procedures and will be seen back prior to go over in greater detail and was encouraged to call with any questions she may have and I gave her information today on bunions and hammertoes  X-rays indicate there is structural bunion deformity with elevation of the intermetatarsal angle bilateral with dorsal dislocation digit to left with rigid contracture and hammertoe deformity fifth left with rotation of the toe

## 2018-04-03 ENCOUNTER — Other Ambulatory Visit: Payer: Self-pay | Admitting: Internal Medicine

## 2018-04-03 MED ORDER — ATORVASTATIN CALCIUM 80 MG PO TABS: 80.0000 mg | ORAL_TABLET | Freq: Every day | ORAL | 2 refills | Status: DC

## 2018-04-03 NOTE — Telephone Encounter (Signed)
New Message:         *STAT* If patient is at the pharmacy, call can be transferred to refill team.   1. Which medications need to be refilled? (please list name of each medication and dose if known) atorvastatin (LIPITOR) 80 MG tablet  2. Which pharmacy/location (including street and city if local pharmacy) is medication to be sent to?[facility] [place] [phone]  3. Do they need a 30 day or 90 day supply? 30

## 2018-04-03 NOTE — Telephone Encounter (Signed)
Rx(s) sent to pharmacy electronically.  

## 2018-06-10 ENCOUNTER — Ambulatory Visit: Payer: BLUE CROSS/BLUE SHIELD | Admitting: Internal Medicine

## 2018-07-01 ENCOUNTER — Telehealth: Payer: Self-pay | Admitting: Internal Medicine

## 2018-07-01 NOTE — Telephone Encounter (Signed)
Spoke with patient and [facility] but not sure what was done. She is trying to find out so no duplicates. She will call back to let us know what labs done. Advised labs could be done Thursday for visit Friday.

## 2018-07-01 NOTE — Telephone Encounter (Signed)
New Message:    Pt is scheduled to see Dr Rennis GoldenHilty on Friday. She wants to know if she have her lab work on Thursday, will it be ready for her appt on Friday?

## 2018-07-03 ENCOUNTER — Other Ambulatory Visit: Payer: Self-pay | Admitting: Obstetrics and Gynecology

## 2018-07-03 DIAGNOSIS — R928 Other abnormal and inconclusive findings on diagnostic imaging of breast: Secondary | ICD-10-CM

## 2018-07-03 LAB — LIPID PANEL
CHOL/HDL RATIO: 3.2 ratio (ref 0.0–4.4)
Cholesterol, Total: 222 mg/dL — ABNORMAL HIGH (ref 100–199)
HDL: 69 mg/dL (ref >39)
LDL CALC: 133 mg/dL — AB (ref 0–99)
TRIGLYCERIDES: 101 mg/dL (ref 0–149)
VLDL Cholesterol Cal: 20 mg/dL (ref 5–40)

## 2018-07-04 ENCOUNTER — Ambulatory Visit
Admission: RE | Admit: 2018-07-04 | Discharge: 2018-07-04 | Disposition: A | Discharge status: Home / Self Care | Payer: BLUE CROSS/BLUE SHIELD | Source: Ambulatory Visit | Attending: Obstetrics and Gynecology | Admitting: Obstetrics and Gynecology

## 2018-07-04 ENCOUNTER — Encounter: Payer: Self-pay | Admitting: Internal Medicine

## 2018-07-04 ENCOUNTER — Ambulatory Visit (INDEPENDENT_AMBULATORY_CARE_PROVIDER_SITE_OTHER): Payer: BLUE CROSS/BLUE SHIELD | Admitting: Internal Medicine

## 2018-07-04 ENCOUNTER — Other Ambulatory Visit: Payer: Self-pay | Admitting: Obstetrics and Gynecology

## 2018-07-04 VITALS — BP 124/84 | HR 74 | Ht 66.0 in | Wt 183.0 lb

## 2018-07-04 DIAGNOSIS — R928 Other abnormal and inconclusive findings on diagnostic imaging of breast: Secondary | ICD-10-CM

## 2018-07-04 DIAGNOSIS — R002 Palpitations: Secondary | ICD-10-CM

## 2018-07-04 DIAGNOSIS — E785 Hyperlipidemia, unspecified: Secondary | ICD-10-CM | POA: Diagnosis not present

## 2018-07-04 DIAGNOSIS — Z8249 Family history of ischemic heart disease and other diseases of the circulatory system: Secondary | ICD-10-CM

## 2018-07-04 MED ORDER — ATORVASTATIN CALCIUM 80 MG PO TABS: 80.0000 mg | ORAL_TABLET | Freq: Every day | ORAL | 3 refills | Status: DC

## 2018-07-04 NOTE — Progress Notes (Signed)
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 She also had an elevated lipoprotein, Lp(a), and an improved dyslipidemia on Lipitor 80 mg daily. She also takes Wellbutrin and Prozac. She is asymptomatic; however, has gained weight, is up to 184 and laboratory work last year demonstrated total cholesterol of 196 and LDL of 116. As mentioned has significant life stressors and decided after concerns with possible muscle aches and memory issues to discontinue Lipitor. She's been off that for several months and recently had a repeat lipid profile which was significantly abnormal. Her total cholesterol was 314, triglycerides 183, HDL 58 and LDL 219. She remains asymptomatic, however is at significant risk given her abnormal cholesterol profile.  Shannon Gallagher returns today for followup. She says that this past year has been particularly difficult. Her husband, an Buyer, retailairline pilot had filed for divorce. [place] [facility] [place] and it is a difficult time. She's been under significant amount of stress. Her diet has not been as good as it could be. She did have recent lipid profile drawn which shows a total cholesterol of 229, LDL particle number of 1410, LDL calculated 135, HDL calculated 66 and triglycerides 140. This is improved and she is currently however on full dose Crestor 40 mg daily. She says this medication does give her cramps particularly in her hamstrings at night.  Unfortunately her cholesterol is still not at goal based on her strong family history of coronary disease. As she is on maximal tolerated statin therapy, she may be a candidate for a PCSK9 inhibitor.  However, we do not have evidence that she has existing coronary  disease.  12/10/2016  Shannon Gallagher returns today for follow-up. It's been about 2-1/2 years since I saw her in the office. She returns today for follow-up and is complaining of palpitations. At the time she had filed for divorce but however this is not totally settled and there is an [place] that remains. She is under significant stress from this. She's recently been having some more palpitations. She does see a psychiatrist, Dr. 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 She also likely has ADD and [facility]. In addition, she was given some Xanax for anxiety. She reports her palpitations are worse particularly when getting bad news, communications from her ex-husband or communications from the attorney. She said they tend to be very short-lived. Sometimes Xanax actually helps improve the palpitations. The frequency of these palpitations have interestingly increased somewhat by my observation after starting on ADD medication. She denies any chest pain. As a reminder she underwent a coronary artery calcium score in 2015 which showed 0 coronary calcium. Despite this she had somewhat of an elevated lipid profile and a strong family history of premature coronary disease in her father. She was intolerant of statins but was ultimately placed on atorvastatin 80 mg and has had a marked improvement in her lipid profile. Her recent lipid profile from 222/18 showed total cholesterol 184, HDL-C 66, LDL-C101, triglycerides 85, non-HDL cholesterol of 118. This is on therapy. According to current guidelines, one could argue given her 0 coronary calcium score her risk is very low and that statin  therapy may not even be indicated, although her cholesterol is expected to be much higher based on her response to high potency statin therapy. I did calculate a Mesa risk score. Given her traditional risk factors her 10 year risk of  CHD event is only 2%, and given her 0 calcium score at risk lowered to 1.4% which is similar to the general population given her age.  07/04/2018  [name] returns today for follow-up.  Overall she seems to be doing well.  She said this last year has been tough as she had torn her meniscus in her right knee.  This required arthroscopic procedure.  She has been a little slow to recover from that.  She has had some fluctuation in her weight although she is only slightly up in weight today.  Her LDL cholesterol however has increased from 101 up to 133.  She reports compliance with atorvastatin but says that her diet has not been necessarily is clean as it had been last year.  In addition her exercise is decreased significantly.  It is known that she has a 0 coronary artery calcium score, however her LDL cholesterol still quite high given the fact she is on high intensity statin, suggesting the possibility of an inherited dyslipidemia.  Arguing against this however is no early onset coronary disease in the family despite the fact that there is a high number of individuals with elevated cholesterol.  Based on this I would favor continued therapy with at least 50% reduction in LDL.  PMHx:  Past Medical History:  Diagnosis Date  . Depression   . Dyslipidemia    Positive KIF6 genotype.  Elevated LP(a)  . Hyperhomocysteinemia (HCC)     Past Surgical History:  Procedure Laterality Date  . 2D Echo  05/30/2007   EF > 55%, Trace MR.   Marland Kitchen Exercise stress test  07/17/2007   Work load 15 METS.  No CP or EKG changes.    FAMHx:  History reviewed. No pertinent family history.  SOCHx:   reports that she has never smoked. She has never used smokeless tobacco. She reports that she drinks alcohol. She reports that she does not use drugs.  ALLERGIES:  No Known Allergies  ROS: Pertinent items noted in HPI and remainder of comprehensive ROS otherwise negative.  HOME MEDS: Current Outpatient Medications    Medication Sig Dispense Refill  . atorvastatin (LIPITOR) 80 MG tablet Take 1 tablet (80 mg total) by mouth daily. 30 tablet 2  . buPROPion (WELLBUTRIN XL) 150 MG 24 hr tablet Take 450 mg by mouth daily.     . Esomeprazole Magnesium (NEXIUM 24HR PO) Take 40 mg by mouth daily.     Marland Kitchen FLUoxetine (PROZAC) 40 MG capsule Take 40 mg by mouth daily.    Marland Kitchen LORazepam (ATIVAN) 1 MG tablet Take 1 mg by mouth every 8 (eight) hours as needed.     . MELOXICAM PO Take 1 tablet by mouth as needed.    Marland Kitchen VYVANSE 70 MG capsule Take 1 capsule by mouth daily.  0   No current facility-administered medications for this visit.     LABS/IMAGING: Results for orders placed or performed in visit on 10/10/17 (from the past 48 hour(s))  Lipid panel     Status: Abnormal   Collection Time: 07/03/18  8:31 AM  Result Value Ref Range   Cholesterol, Total 222 (H) 100 - 199 mg/dL   Triglycerides 409 0 - 149 mg/dL   HDL 69 >81 mg/dL  VLDL Cholesterol Cal 20 5 - 40 mg/dL   LDL Calculated 161 (H) 0 - 99 mg/dL   Chol/HDL Ratio 3.2 0.0 - 4.4 ratio    Comment:                                   T. Chol/HDL Ratio                                             Men  Women                               1/2 Avg.Risk  3.4    3.3                                   Avg.Risk  5.0    4.4                                2X Avg.Risk  9.6    7.1                                3X Avg.Risk 23.4   11.0    No results found.  VITALS: BP 124/84 (BP Location: Left Arm, Patient Position: Sitting, Cuff Size: Normal)   Pulse 74   Ht 5\' 6"  (1.676 m)   Wt 183 lb (83 kg)   BMI 29.54 kg/m   EXAM: General appearance: alert and no distress Neck: no carotid bruit and no JVD Lungs: clear to auscultation bilaterally Heart: regular rate and rhythm, S1, S2 normal, no murmur, click, rub or gallop Abdomen: soft, non-tender; bowel sounds normal; no masses,  no organomegaly Extremities: extremities normal, atraumatic, no cyanosis or edema Pulses: 2+ and  symmetric Skin: Skin color, texture, turgor normal. No rashes or lesions Neurologic: Grossly normal Psych: Tearful, emotional about life stresses  EKG: Sinus rhythm 74, possible LAE-personally reviewed  ASSESSMENT: 1. History of palpitations 2. Dyslipidemia - improved on Lipitor 80 mg 3. CAC 0 - (2015) 4. History of depression with significant life stressors 5. Strong family history of coronary disease  PLAN: 1.   Shannon Gallagher is doing well and remains asymptomatic.  Her exercise is decreased over the past year due to a knee injury and other factors and her diet also has not been as clean as it had been in the past.  This been associated with an increase in LDL cholesterol.  I have encouraged her to continue her current dose atorvastatin and work on more exercise and healthier diet to get back to her numbers of last year.  Overall her risk is being mitigated on this dose and I would not recommend additional therapy at this time.  She is not symptomatic with palpitations at this point and this is probably driven by significant life stressors with divorce.  Plan to see her back annually or sooner if monitor is necessary.  Chrystie Nose, MD, [facility], FACP  [place]  [facility]  Medical Director of the Advanced Lipid Disorders &  Cardiovascular Risk Reduction Clinic Diplomate of  the [employer] of Clinical Lipidology Attending Cardiologist  Direct Dial: [phone]  Fax: [phone]  Website:  [email or web address] 07/04/2018, 8:16 AM

## 2018-07-04 NOTE — Patient Instructions (Signed)
Your physician wants you to follow-up in: ONE YEAR with Dr. Hilty. You will receive a reminder letter in the mail two months in advance. If you don't receive a letter, please call our office to schedule the follow-up appointment.  

## 2018-07-09 ENCOUNTER — Other Ambulatory Visit: Payer: Self-pay | Admitting: Obstetrics and Gynecology

## 2018-07-09 DIAGNOSIS — R921 Mammographic calcification found on diagnostic imaging of breast: Secondary | ICD-10-CM

## 2018-12-08 DIAGNOSIS — F411 Generalized anxiety disorder: Secondary | ICD-10-CM | POA: Diagnosis not present

## 2018-12-08 DIAGNOSIS — F341 Dysthymic disorder: Secondary | ICD-10-CM | POA: Diagnosis not present

## 2018-12-23 DIAGNOSIS — Z800 Family history of malignant neoplasm of digestive organs: Secondary | ICD-10-CM | POA: Diagnosis not present

## 2018-12-23 DIAGNOSIS — K200 Eosinophilic esophagitis: Secondary | ICD-10-CM | POA: Diagnosis not present

## 2018-12-23 DIAGNOSIS — K219 Gastro-esophageal reflux disease without esophagitis: Secondary | ICD-10-CM | POA: Diagnosis not present

## 2019-01-05 ENCOUNTER — Other Ambulatory Visit: Payer: Self-pay | Admitting: Obstetrics and Gynecology

## 2019-01-05 ENCOUNTER — Other Ambulatory Visit: Payer: Self-pay

## 2019-01-05 ENCOUNTER — Ambulatory Visit
Admission: RE | Admit: 2019-01-05 | Discharge: 2019-01-05 | Disposition: A | Discharge status: Home / Self Care | Payer: BLUE CROSS/BLUE SHIELD | Source: Ambulatory Visit | Attending: Obstetrics and Gynecology | Admitting: Obstetrics and Gynecology

## 2019-01-05 DIAGNOSIS — R921 Mammographic calcification found on diagnostic imaging of breast: Secondary | ICD-10-CM

## 2019-01-15 DIAGNOSIS — F341 Dysthymic disorder: Secondary | ICD-10-CM | POA: Diagnosis not present

## 2019-01-15 DIAGNOSIS — F411 Generalized anxiety disorder: Secondary | ICD-10-CM | POA: Diagnosis not present

## 2019-02-09 DIAGNOSIS — R222 Localized swelling, mass and lump, trunk: Secondary | ICD-10-CM | POA: Diagnosis not present

## 2019-02-19 DIAGNOSIS — F411 Generalized anxiety disorder: Secondary | ICD-10-CM | POA: Diagnosis not present

## 2019-02-19 DIAGNOSIS — F341 Dysthymic disorder: Secondary | ICD-10-CM | POA: Diagnosis not present

## 2019-04-01 DIAGNOSIS — F411 Generalized anxiety disorder: Secondary | ICD-10-CM | POA: Diagnosis not present

## 2019-04-01 DIAGNOSIS — F341 Dysthymic disorder: Secondary | ICD-10-CM | POA: Diagnosis not present

## 2019-04-28 DIAGNOSIS — M1711 Unilateral primary osteoarthritis, right knee: Secondary | ICD-10-CM | POA: Diagnosis not present

## 2019-04-29 DIAGNOSIS — F3342 Major depressive disorder, recurrent, in full remission: Secondary | ICD-10-CM | POA: Diagnosis not present

## 2019-04-29 DIAGNOSIS — F900 Attention-deficit hyperactivity disorder, predominantly inattentive type: Secondary | ICD-10-CM | POA: Diagnosis not present

## 2019-05-26 DIAGNOSIS — M1711 Unilateral primary osteoarthritis, right knee: Secondary | ICD-10-CM | POA: Diagnosis not present

## 2019-06-03 DIAGNOSIS — M1711 Unilateral primary osteoarthritis, right knee: Secondary | ICD-10-CM | POA: Diagnosis not present

## 2019-06-11 DIAGNOSIS — M1711 Unilateral primary osteoarthritis, right knee: Secondary | ICD-10-CM | POA: Diagnosis not present

## 2019-06-23 DIAGNOSIS — I87393 Chronic venous hypertension (idiopathic) with other complications of bilateral lower extremity: Secondary | ICD-10-CM | POA: Diagnosis not present

## 2019-07-02 DIAGNOSIS — M1711 Unilateral primary osteoarthritis, right knee: Secondary | ICD-10-CM | POA: Diagnosis not present

## 2019-07-02 DIAGNOSIS — M25461 Effusion, right knee: Secondary | ICD-10-CM | POA: Diagnosis not present

## 2019-07-02 DIAGNOSIS — M25561 Pain in right knee: Secondary | ICD-10-CM | POA: Diagnosis not present

## 2019-07-07 ENCOUNTER — Telehealth: Payer: Self-pay | Admitting: Internal Medicine

## 2019-07-07 DIAGNOSIS — E785 Hyperlipidemia, unspecified: Secondary | ICD-10-CM

## 2019-07-07 NOTE — Telephone Encounter (Signed)
Called patient- advised of message from MD. Patient advised that lab work ordered. Will have EKG in office during visit. No other questions at this time.

## 2019-07-07 NOTE — Telephone Encounter (Signed)
New Message  Patient has schedule annual visit with Dr. Debara Pickett on 08/28/19 at 3:30 pm. Patient would like to know if she will be needing to do blood work to check her cholesterol and an EKG. No notes or active requests about it in chart. Please assist.

## 2019-07-07 NOTE — Telephone Encounter (Signed)
Yes both, lipid panel prior and EKG at the visit.  Thanks,  Dr Lemmie Evens

## 2019-07-07 NOTE — Telephone Encounter (Signed)
Anything needed prior to appointment? Thank you !

## 2019-07-08 NOTE — Telephone Encounter (Signed)
MyChart message sent reminding patient to have lab work PRIOR to her appointment

## 2019-07-09 ENCOUNTER — Ambulatory Visit
Admission: RE | Admit: 2019-07-09 | Discharge: 2019-07-09 | Disposition: A | Discharge status: Home / Self Care | Payer: BC Managed Care – PPO | Source: Ambulatory Visit | Attending: Obstetrics and Gynecology | Admitting: Obstetrics and Gynecology

## 2019-07-09 ENCOUNTER — Other Ambulatory Visit: Payer: Self-pay

## 2019-07-09 DIAGNOSIS — R922 Inconclusive mammogram: Secondary | ICD-10-CM | POA: Diagnosis not present

## 2019-07-09 DIAGNOSIS — R921 Mammographic calcification found on diagnostic imaging of breast: Secondary | ICD-10-CM

## 2019-07-13 DIAGNOSIS — Z01419 Encounter for gynecological examination (general) (routine) without abnormal findings: Secondary | ICD-10-CM | POA: Diagnosis not present

## 2019-07-13 DIAGNOSIS — N951 Menopausal and female climacteric states: Secondary | ICD-10-CM | POA: Diagnosis not present

## 2019-07-13 DIAGNOSIS — Z6829 Body mass index (BMI) 29.0-29.9, adult: Secondary | ICD-10-CM | POA: Diagnosis not present

## 2019-07-30 DIAGNOSIS — M1711 Unilateral primary osteoarthritis, right knee: Secondary | ICD-10-CM | POA: Diagnosis not present

## 2019-08-26 ENCOUNTER — Other Ambulatory Visit: Payer: Self-pay

## 2019-08-26 DIAGNOSIS — E785 Hyperlipidemia, unspecified: Secondary | ICD-10-CM

## 2019-08-26 LAB — LIPID PANEL
Chol/HDL Ratio: 3.2 ratio (ref 0.0–4.4)
Cholesterol, Total: 227 mg/dL — ABNORMAL HIGH (ref 100–199)
HDL: 72 mg/dL (ref >39)
LDL Chol Calc (NIH): 138 mg/dL — ABNORMAL HIGH (ref 0–99)
Triglycerides: 99 mg/dL (ref 0–149)
VLDL Cholesterol Cal: 17 mg/dL (ref 5–40)

## 2019-08-28 ENCOUNTER — Encounter: Payer: Self-pay | Admitting: Internal Medicine

## 2019-08-28 ENCOUNTER — Other Ambulatory Visit: Payer: Self-pay

## 2019-08-28 ENCOUNTER — Ambulatory Visit (INDEPENDENT_AMBULATORY_CARE_PROVIDER_SITE_OTHER): Payer: BLUE CROSS/BLUE SHIELD | Admitting: Internal Medicine

## 2019-08-28 VITALS — BP 149/96 | HR 77 | Temp 97.1°F | Ht 66.0 in | Wt 181.0 lb

## 2019-08-28 DIAGNOSIS — Z8249 Family history of ischemic heart disease and other diseases of the circulatory system: Secondary | ICD-10-CM | POA: Diagnosis not present

## 2019-08-28 DIAGNOSIS — E785 Hyperlipidemia, unspecified: Secondary | ICD-10-CM

## 2019-08-28 NOTE — Progress Notes (Signed)
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 She also had an elevated lipoprotein, Lp(a), and an improved dyslipidemia on Lipitor 80 mg daily. She also takes Wellbutrin and Prozac. She is asymptomatic; however, has gained weight, is up to 184 and laboratory work last year demonstrated total cholesterol of 196 and LDL of 116. As mentioned has significant life stressors and decided after concerns with possible muscle aches and memory issues to discontinue Lipitor. She's been off that for several months and recently had a repeat lipid profile which was significantly abnormal. Her total cholesterol was 314, triglycerides 183, HDL 58 and LDL 219. She remains asymptomatic, however is at significant risk given her abnormal cholesterol profile.  Shannon Gallagher returns today for followup. She says that this past year has been particularly difficult. Her husband, an Buyer, retail had filed for divorce. She has 2 children at [facility] state and it is a difficult time. She's been under significant amount of stress. Her diet has not been as good as it could be. She did have recent lipid profile drawn which shows a total cholesterol of 229, LDL particle number of 1410, LDL calculated 135, HDL calculated 66 and triglycerides 140. This is improved and she is currently however on full dose Crestor 40 mg daily. She says this medication does give her cramps particularly in her hamstrings at night.  Unfortunately her cholesterol is still not at goal based on her strong family history of coronary disease. As she is on maximal tolerated statin therapy, she may be a candidate for a PCSK9 inhibitor.  However, we do not have evidence that she has existing coronary disease.   12/10/2016  Shannon Gallagher returns today for follow-up. It's been about 2-1/2 years since I saw her in the office. She returns today for follow-up and is complaining of palpitations. At the time she had filed for divorce but however this is not totally settled and there is an [place] question that remains. She is under significant stress from this. She's recently been having some more palpitations. She does see a psychiatrist, Dr. 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 She also likely has ADD and recently started [facility] for treatment of that. In addition, she was given some Xanax for anxiety. She reports her palpitations are worse particularly when getting bad news, communications from her ex-husband or communications from the attorney. She said they tend to be very short-lived. Sometimes Xanax actually helps improve the palpitations. The frequency of these palpitations have interestingly increased somewhat by my observation after starting on ADD medication. She denies any chest pain. As a reminder she underwent a coronary artery calcium score in 2015 which showed 0 coronary calcium. Despite this she had somewhat of an elevated lipid profile and a strong family history of premature coronary disease in her father. She was intolerant of statins but was ultimately placed on atorvastatin 80 mg and has had a marked improvement in her lipid profile. Her recent lipid profile from 222/18 showed total cholesterol 184, HDL-C 66, LDL-C101, triglycerides 85, non-HDL cholesterol of 118. This is on therapy. According to current guidelines, one could argue given her 0 coronary calcium score her risk is very low and that statin therapy  may not even be indicated, although her cholesterol is expected to be much higher based on her response to high potency statin therapy. I did calculate a Mesa risk score. Given her traditional risk factors her 10 year risk of CHD event is  only 2%, and given her 0 calcium score at risk lowered to 1.4% which is similar to the general population given her age.  07/04/2018  Shannon Gallagher returns today for follow-up.  Overall she seems to be doing well.  She said this last year has been tough as she had torn her meniscus in her right knee.  This required arthroscopic procedure.  She has been a little slow to recover from that.  She has had some fluctuation in her weight although she is only slightly up in weight today.  Her LDL cholesterol however has increased from 101 up to 133.  She reports compliance with atorvastatin but says that her diet has not been necessarily is clean as it had been last year.  In addition her exercise is decreased significantly.  It is known that she has a 0 coronary artery calcium score, however her LDL cholesterol still quite high given the fact she is on high intensity statin, suggesting the possibility of an inherited dyslipidemia.  Arguing against this however is no early onset coronary disease in the family despite the fact that there is a high number of individuals with elevated cholesterol.  Based on this I would favor continued therapy with at least 50% reduction in LDL.  08/28/2019  Shannon Gallagher returns today for follow-up.  Overall she continues to do well.  She says she is not exercising as much as she had been recently.  Her daughter is now living with her during Covid.  She has plans of going on to graduate school.  Cholesterol was recently reassessed.  It seems fairly stable total 227, triglycerides 99, HDL 72 and LDL 138.  In the past she had had lipoprotein particle testing which was in the 1400s and small LDL P was a little elevated.  She apparently also had an elevated LP(a), however I cannot find that initial test which was performed by Dr.Solomon prior to me assuming her care in 2013.  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  PMHx:  Past Medical History:  Diagnosis Date  . Depression    . Dyslipidemia    Positive KIF6 genotype.  Elevated LP(a)  . Hyperhomocysteinemia ([place])     Past Surgical History:  Procedure Laterality Date  . 2D Echo  05/30/2007   EF > 55%, Trace MR.   Marland Kitchen Exercise stress test  07/17/2007   Work load 15 METS.  No CP or EKG changes.    FAMHx:  No family history on file.  SOCHx:   reports that she has never smoked. She has never used smokeless tobacco. She reports current alcohol use. She reports that she does not use drugs.  ALLERGIES:  No Known Allergies  ROS: Pertinent items noted in HPI and remainder of comprehensive ROS otherwise negative.  HOME MEDS: Current Outpatient Medications  Medication Sig Dispense Refill  . atorvastatin (LIPITOR) 80 MG tablet Take 1 tablet (80 mg total) by mouth daily. 90 tablet 3  . buPROPion (WELLBUTRIN XL) 150 MG 24 hr tablet Take 450 mg by mouth daily.     . diclofenac (VOLTAREN) 50 MG EC tablet diclofenac sodium 50 mg tablet,delayed release    . Esomeprazole Magnesium (NEXIUM 24HR PO) Take 40 mg by  mouth daily.     Marland Kitchen. FLOVENT HFA 110 MCG/ACT inhaler     . FLUoxetine (PROZAC) 40 MG capsule Take 40 mg by mouth daily.    Marland Kitchen. LORazepam (ATIVAN) 1 MG tablet Take 1 mg by mouth every 8 (eight) hours as needed.     Marland Kitchen. VYVANSE 70 MG capsule Take 1 capsule by mouth daily.  0  . MELOXICAM PO Take 1 tablet by mouth as needed.     No current facility-administered medications for this visit.     LABS/IMAGING: No results found for this or any previous visit (from the past 48 hour(s)). No results found.  VITALS: BP (!) 149/96   Pulse 77   Temp (!) 97.1 F (36.2 C)   Ht 5\' 6"  (1.676 m)   Wt 181 lb (82.1 kg)   SpO2 97%   BMI 29.21 kg/m   EXAM: General appearance: alert and no distress Neck: no carotid bruit and no JVD Lungs: clear to auscultation bilaterally Heart: regular rate and rhythm, S1, S2 normal, no murmur, click, rub or gallop Abdomen: soft, non-tender; bowel sounds normal; no masses,  no  organomegaly Extremities: extremities normal, atraumatic, no cyanosis or edema Pulses: 2+ and symmetric Skin: Skin color, texture, turgor normal. No rashes or lesions Neurologic: Grossly normal Psych: Tearful, emotional about life stresses  EKG: Normal sinus rhythm 76, nonspecific ST changes-personally reviewed  ASSESSMENT: 1. History of palpitations 2. Dyslipidemia - improved on Lipitor 80 mg 3. CAC 0 - (2015) 4. History of depression with significant life stressors 5. Strong family history of coronary disease  PLAN: 1.   Shannon Gallagher continues to do well.  She had a 0 coronary calcium score in 2015.  She had a couple episodes including one recent episode of some nausea and episode of vomiting after being exposed to noxious scent but she recovered quickly.  She thought this could be heart attack however it seems atypical.  EKG shows no Q waves today.  I think it is also unlikely given no coronary calcium.  Her palpitations have not been significant.  I would plan to continue her current dose of atorvastatin.  We will also add an LP(a) since I cannot find the initial testing to see if it significantly elevated as this might be associated with early onset heart disease, however interestingly her calcium score was 0.  Plan to see her back annually or sooner if monitor is necessary.  Chrystie NoseKenneth C. Yadira Hada, MD, [facility]ACC, FACP  [place]  [facility]  Medical Director of the Advanced Lipid Disorders &  Cardiovascular Risk Reduction Clinic Diplomate of the American Board of Clinical Lipidology Attending Cardiologist  Direct Dial: [phone]  Fax: [phone]  Website:  [email or web address]   Julia Kulzer C Luie Laneve 08/28/2019, 3:48 PM

## 2019-08-28 NOTE — Patient Instructions (Signed)
Medication Instructions:  Your physician recommends that you continue on your current medications as directed. Please refer to the Current Medication list given to you today.  *If you need a refill on your cardiac medications before your next appointment, please call your pharmacy*  Lab Work: Lab Work - LP(a) If you have labs (blood work) drawn today and your tests are completely normal, you will receive your results only by: Marland Kitchen MyChart Message (if you have MyChart) OR . A paper copy in the mail If you have any lab test that is abnormal or we need to change your treatment, we will call you to review the results.  Follow-Up: At [facility], you and your health needs are our priority.  As part of our continuing mission to provide you with exceptional heart care, we have created designated Provider Care Teams.  These Care Teams include your primary Cardiologist (physician) and Advanced Practice Providers (APPs -  Physician Assistants and Nurse Practitioners) who all work together to provide you with the care you need, when you need it.  Your next appointment:   12 months  The format for your next appointment:   In Person  Provider:   K. Mali Hilty, MD  Other Instructions

## 2019-09-04 LAB — LIPOPROTEIN A (LPA): Lipoprotein (a): 210.7 nmol/L — ABNORMAL HIGH (ref <75.0)

## 2019-09-04 LAB — SPECIMEN STATUS REPORT

## 2019-09-23 ENCOUNTER — Other Ambulatory Visit: Payer: Self-pay | Admitting: Internal Medicine

## 2019-11-18 DIAGNOSIS — F900 Attention-deficit hyperactivity disorder, predominantly inattentive type: Secondary | ICD-10-CM | POA: Diagnosis not present

## 2019-11-18 DIAGNOSIS — F3342 Major depressive disorder, recurrent, in full remission: Secondary | ICD-10-CM | POA: Diagnosis not present

## 2020-01-05 ENCOUNTER — Encounter: Payer: BLUE CROSS/BLUE SHIELD | Admitting: Registered"

## 2020-01-07 ENCOUNTER — Ambulatory Visit: Payer: BLUE CROSS/BLUE SHIELD | Attending: Family

## 2020-01-07 DIAGNOSIS — Z23 Encounter for immunization: Secondary | ICD-10-CM

## 2020-01-07 NOTE — Progress Notes (Signed)
   Covid-19 Vaccination Clinic  Name:  CAPRINA WUSSOW    MRN: [id number] DOB: Sep 17, 1961  01/07/2020  Ms. Gullikson was observed post Covid-19 immunization for 15 minutes without incident. She was provided with Vaccine Information Sheet and instruction to access the V-Safe system.   Ms. Reller was instructed to call 911 with any severe reactions post vaccine: Marland Kitchen Difficulty breathing  . Swelling of face and throat  . A fast heartbeat  . A bad rash all over body  . Dizziness and weakness   Immunizations Administered    Name Date Dose VIS Date Route   Moderna COVID-19 Vaccine 01/07/2020  4:19 PM 0.5 mL 09/15/2019 Intramuscular   Manufacturer: Moderna   Lot: [id number]   NDC: 32355-732-20

## 2020-01-14 DIAGNOSIS — K200 Eosinophilic esophagitis: Secondary | ICD-10-CM | POA: Diagnosis not present

## 2020-01-14 DIAGNOSIS — Z86010 Personal history of colonic polyps: Secondary | ICD-10-CM | POA: Diagnosis not present

## 2020-01-14 DIAGNOSIS — E669 Obesity, unspecified: Secondary | ICD-10-CM | POA: Diagnosis not present

## 2020-01-14 DIAGNOSIS — K219 Gastro-esophageal reflux disease without esophagitis: Secondary | ICD-10-CM | POA: Diagnosis not present

## 2020-02-04 ENCOUNTER — Ambulatory Visit: Payer: BLUE CROSS/BLUE SHIELD | Attending: Family

## 2020-02-04 DIAGNOSIS — Z23 Encounter for immunization: Secondary | ICD-10-CM

## 2020-02-04 NOTE — Progress Notes (Signed)
   Covid-19 Vaccination Clinic  Name:  Shannon Gallagher    MRN: [id number] DOB: 1960/12/29  02/04/2020  [name] was observed post Covid-19 immunization for 15 minutes without incident. She was provided with Vaccine Information Sheet and instruction to access the V-Safe system.   [name] was instructed to call 911 with any severe reactions post vaccine: Marland Kitchen Difficulty breathing  . Swelling of face and throat  . A fast heartbeat  . A bad rash all over body  . Dizziness and weakness   Immunizations Administered    Name Date Dose VIS Date Route   Moderna COVID-19 Vaccine 02/04/2020  4:11 PM 0.5 mL 09/2019 Intramuscular   Manufacturer: Moderna   Lot: [id number]   NDC: 67124-580-99

## 2020-02-09 ENCOUNTER — Ambulatory Visit: Payer: BLUE CROSS/BLUE SHIELD

## 2020-02-15 DIAGNOSIS — J3089 Other allergic rhinitis: Secondary | ICD-10-CM | POA: Diagnosis not present

## 2020-02-15 DIAGNOSIS — J301 Allergic rhinitis due to pollen: Secondary | ICD-10-CM | POA: Diagnosis not present

## 2020-02-15 DIAGNOSIS — K200 Eosinophilic esophagitis: Secondary | ICD-10-CM | POA: Diagnosis not present

## 2020-03-10 DIAGNOSIS — M7061 Trochanteric bursitis, right hip: Secondary | ICD-10-CM | POA: Diagnosis not present

## 2020-03-10 DIAGNOSIS — M170 Bilateral primary osteoarthritis of knee: Secondary | ICD-10-CM | POA: Diagnosis not present

## 2020-03-10 DIAGNOSIS — M65342 Trigger finger, left ring finger: Secondary | ICD-10-CM | POA: Diagnosis not present

## 2020-03-10 DIAGNOSIS — M7062 Trochanteric bursitis, left hip: Secondary | ICD-10-CM | POA: Diagnosis not present

## 2020-03-22 ENCOUNTER — Other Ambulatory Visit: Payer: Self-pay | Admitting: Internal Medicine

## 2020-04-07 DIAGNOSIS — M170 Bilateral primary osteoarthritis of knee: Secondary | ICD-10-CM | POA: Diagnosis not present

## 2020-04-14 DIAGNOSIS — M170 Bilateral primary osteoarthritis of knee: Secondary | ICD-10-CM | POA: Diagnosis not present

## 2020-04-21 DIAGNOSIS — M170 Bilateral primary osteoarthritis of knee: Secondary | ICD-10-CM | POA: Diagnosis not present

## 2020-05-06 DIAGNOSIS — F3342 Major depressive disorder, recurrent, in full remission: Secondary | ICD-10-CM | POA: Diagnosis not present

## 2020-05-06 DIAGNOSIS — F900 Attention-deficit hyperactivity disorder, predominantly inattentive type: Secondary | ICD-10-CM | POA: Diagnosis not present

## 2020-06-09 DIAGNOSIS — D225 Melanocytic nevi of trunk: Secondary | ICD-10-CM | POA: Diagnosis not present

## 2020-06-09 DIAGNOSIS — Z1283 Encounter for screening for malignant neoplasm of skin: Secondary | ICD-10-CM | POA: Diagnosis not present

## 2020-06-28 ENCOUNTER — Other Ambulatory Visit: Payer: Self-pay | Admitting: Internal Medicine

## 2020-07-27 DIAGNOSIS — Z01419 Encounter for gynecological examination (general) (routine) without abnormal findings: Secondary | ICD-10-CM | POA: Diagnosis not present

## 2020-07-27 DIAGNOSIS — Z6830 Body mass index (BMI) 30.0-30.9, adult: Secondary | ICD-10-CM | POA: Diagnosis not present

## 2020-08-02 ENCOUNTER — Other Ambulatory Visit: Payer: Self-pay | Admitting: Obstetrics and Gynecology

## 2020-08-02 DIAGNOSIS — M65342 Trigger finger, left ring finger: Secondary | ICD-10-CM | POA: Diagnosis not present

## 2020-08-02 DIAGNOSIS — R921 Mammographic calcification found on diagnostic imaging of breast: Secondary | ICD-10-CM

## 2020-08-10 DIAGNOSIS — N870 Mild cervical dysplasia: Secondary | ICD-10-CM | POA: Diagnosis not present

## 2020-08-10 DIAGNOSIS — R87612 Low grade squamous intraepithelial lesion on cytologic smear of cervix (LGSIL): Secondary | ICD-10-CM | POA: Diagnosis not present

## 2020-08-10 DIAGNOSIS — R87810 Cervical high risk human papillomavirus (HPV) DNA test positive: Secondary | ICD-10-CM | POA: Diagnosis not present

## 2020-08-17 ENCOUNTER — Ambulatory Visit
Admission: RE | Admit: 2020-08-17 | Discharge: 2020-08-17 | Disposition: A | Discharge status: Home / Self Care | Payer: BLUE CROSS/BLUE SHIELD | Source: Ambulatory Visit | Attending: Obstetrics and Gynecology | Admitting: Obstetrics and Gynecology

## 2020-08-17 ENCOUNTER — Other Ambulatory Visit: Payer: Self-pay

## 2020-08-17 DIAGNOSIS — R921 Mammographic calcification found on diagnostic imaging of breast: Secondary | ICD-10-CM | POA: Diagnosis not present

## 2020-09-01 ENCOUNTER — Other Ambulatory Visit: Payer: Self-pay | Admitting: Obstetrics and Gynecology

## 2020-09-01 DIAGNOSIS — Z803 Family history of malignant neoplasm of breast: Secondary | ICD-10-CM

## 2020-09-13 ENCOUNTER — Other Ambulatory Visit: Payer: Self-pay

## 2020-09-13 ENCOUNTER — Ambulatory Visit (INDEPENDENT_AMBULATORY_CARE_PROVIDER_SITE_OTHER): Payer: BLUE CROSS/BLUE SHIELD | Admitting: Internal Medicine

## 2020-09-13 ENCOUNTER — Encounter: Payer: Self-pay | Admitting: Internal Medicine

## 2020-09-13 VITALS — BP 130/89 | Temp 96.8°F | Ht 66.0 in | Wt 181.2 lb

## 2020-09-13 DIAGNOSIS — Z8249 Family history of ischemic heart disease and other diseases of the circulatory system: Secondary | ICD-10-CM

## 2020-09-13 DIAGNOSIS — E7841 Elevated Lipoprotein(a): Secondary | ICD-10-CM

## 2020-09-13 DIAGNOSIS — E7849 Other hyperlipidemia: Secondary | ICD-10-CM | POA: Diagnosis not present

## 2020-09-13 DIAGNOSIS — E785 Hyperlipidemia, unspecified: Secondary | ICD-10-CM | POA: Diagnosis not present

## 2020-09-13 LAB — LIPID PANEL
Chol/HDL Ratio: 3.5 ratio (ref 0.0–4.4)
Cholesterol, Total: 244 mg/dL — ABNORMAL HIGH (ref 100–199)
HDL: 69 mg/dL (ref >39)
LDL Chol Calc (NIH): 149 mg/dL — ABNORMAL HIGH (ref 0–99)
Triglycerides: 146 mg/dL (ref 0–149)
VLDL Cholesterol Cal: 26 mg/dL (ref 5–40)

## 2020-09-13 NOTE — Progress Notes (Signed)
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 She also had an elevated lipoprotein, Lp(a), and an improved dyslipidemia on Lipitor 80 mg daily. She also takes Wellbutrin and Prozac. She is asymptomatic; however, has gained weight, is up to 184 and laboratory work last year demonstrated total cholesterol of 196 and LDL of 116. As mentioned has significant life stressors and decided after concerns with possible muscle aches and memory issues to discontinue Lipitor. She's been off that for several months and recently had a repeat lipid profile which was significantly abnormal. Her total cholesterol was 314, triglycerides 183, HDL 58 and LDL 219. She remains asymptomatic, however is at significant risk given her abnormal cholesterol profile.  Mrs. Passey returns today for followup. She says that this past year has been particularly difficult. Her husband, an Buyer, retail had filed for divorce. She has 2 children at [facility] state and it is a difficult time. She's been under significant amount of stress. Her diet has not been as good as it could be. She did have recent lipid profile drawn which shows a total cholesterol of 229, LDL particle number of 1410, LDL calculated 135, HDL calculated 66 and triglycerides 140. This is improved and she is currently however on full dose Crestor 40 mg daily. She says this medication does give her cramps particularly in her hamstrings at night.  Unfortunately her cholesterol is still not at goal based on her strong family history of coronary disease. As she is on maximal tolerated statin therapy, she may be a candidate for a PCSK9 inhibitor.  However, we do not have evidence that she has existing coronary  disease.  12/10/2016  Mrs. Sessa returns today for follow-up. It's been about 2-1/2 years since I saw her in the office. She returns today for follow-up and is complaining of palpitations. At the time she had filed for divorce but however this is not totally settled and there is an [place] question that remains. She is under significant stress from this. She's recently been having some more palpitations. She does see a psychiatrist, Dr. 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 She also likely has ADD and recently started [facility] for treatment of that. In addition, she was given some Xanax for anxiety. She reports her palpitations are worse particularly when getting bad news, communications from her ex-husband or communications from the attorney. She said they tend to be very short-lived. Sometimes Xanax actually helps improve the palpitations. The frequency of these palpitations have interestingly increased somewhat by my observation after starting on ADD medication. She denies any chest pain. As a reminder she underwent a coronary artery calcium score in 2015 which showed 0 coronary calcium. Despite this she had somewhat of an elevated lipid profile and a strong family history of premature coronary disease in her father. She was intolerant of statins but was ultimately placed on atorvastatin 80 mg and has had a marked improvement in her lipid profile. Her recent lipid profile from 222/18 showed total cholesterol 184, HDL-C 66, LDL-C101, triglycerides 85, non-HDL cholesterol of 118. This is on therapy. According to current guidelines, one could argue given her 0 coronary calcium score her risk is very low and that statin  therapy may not even be indicated, although her cholesterol is expected to be much higher based on her response to high potency statin therapy. I did calculate a [place] risk score. Given her traditional risk factors her 10 year risk of  CHD event is only 2%, and given her 0 calcium score at risk lowered to 1.4% which is similar to the general population given her age.  07/04/2018  Ms. Nass returns today for follow-up.  Overall she seems to be doing well.  She said this last year has been tough as she had torn her meniscus in her right knee.  This required arthroscopic procedure.  She has been a little slow to recover from that.  She has had some fluctuation in her weight although she is only slightly up in weight today.  Her LDL cholesterol however has increased from 101 up to 133.  She reports compliance with atorvastatin but says that her diet has not been necessarily is clean as it had been last year.  In addition her exercise is decreased significantly.  It is known that she has a 0 coronary artery calcium score, however her LDL cholesterol still quite high given the fact she is on high intensity statin, suggesting the possibility of an inherited dyslipidemia.  Arguing against this however is no early onset coronary disease in the family despite the fact that there is a high number of individuals with elevated cholesterol.  Based on this I would favor continued therapy with at least 50% reduction in LDL.  08/28/2019  [name] returns today for follow-up.  Overall she continues to do well.  She says she is not exercising as much as she had been recently.  Her daughter is now living with her during Covid.  She has plans of going on to graduate school.  Cholesterol was recently reassessed.  It seems fairly stable total 227, triglycerides 99, HDL 72 and LDL 138.  In the past she had had lipoprotein particle testing which was in the 1400s and small LDL P was a little elevated.  She apparently also had an elevated LP(a), however I cannot find that initial test which was performed by Dr.Solomon prior to me assuming her care in 2013.  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  09/13/2020  [name] seen today for follow-up.   She continues to do very well.  She is contemplating switching jobs.  She is not had repeat lipid testing.  She did have a high LP(a) however at 210 which is a significant risk factor for early onset heart disease.  Unfortunately the atorvastatin is not affecting that.  If she consider her lipids without the atorvastatin, her LDL cholesterol is probably much higher likely over 190 suggestive of familial hyperlipidemia.  There is a strong history of family heart disease.  We did discuss today the possibility of her may be being a candidate for anti-LP(a) study.  PMHx:  Past Medical History:  Diagnosis Date  . Depression   . Dyslipidemia    Positive KIF6 genotype.  Elevated LP(a)  . Hyperhomocysteinemia (HCC)     Past Surgical History:  Procedure Laterality Date  . 2D Echo  05/30/2007   EF > 55%, Trace MR.   Marland Kitchen Exercise stress test  07/17/2007   Work load 15 METS.  No CP or EKG changes.    FAMHx:  No family history on file.  SOCHx:   reports that she has never smoked. She has never used smokeless tobacco.  She reports current alcohol use. She reports that she does not use drugs.  ALLERGIES:  No Known Allergies  ROS: Pertinent items noted in HPI and remainder of comprehensive ROS otherwise negative.  HOME MEDS: Current Outpatient Medications  Medication Sig Dispense Refill  . atorvastatin (LIPITOR) 80 MG tablet TAKE 1 TABLET BY MOUTH DAILY. 90 tablet 0  . buPROPion (WELLBUTRIN XL) 150 MG 24 hr tablet Take 450 mg by mouth daily.     . diclofenac (VOLTAREN) 50 MG EC tablet diclofenac sodium 50 mg tablet,delayed release    . Esomeprazole Magnesium (NEXIUM 24HR PO) Take 40 mg by mouth daily.     Marland Kitchen FLOVENT HFA 110 MCG/ACT inhaler     . FLUoxetine (PROZAC) 40 MG capsule Take 40 mg by mouth daily.    Marland Kitchen LORazepam (ATIVAN) 1 MG tablet Take 1 mg by mouth every 8 (eight) hours as needed.     Marland Kitchen VYVANSE 70 MG capsule Take 1 capsule by mouth daily.  0  . MELOXICAM PO Take 1 tablet by mouth as  needed.     No current facility-administered medications for this visit.    LABS/IMAGING: No results found for this or any previous visit (from the past 48 hour(s)). No results found.  VITALS: BP 130/89   Temp (!) 96.8 F (36 C)   Ht 5\' 6"  (1.676 m)   Wt 181 lb 3.2 oz (82.2 kg)   SpO2 96%   BMI 29.25 kg/m   EXAM: General appearance: alert and no distress Neck: no carotid bruit and no JVD Lungs: clear to auscultation bilaterally Heart: regular rate and rhythm, S1, S2 normal, no murmur, click, rub or gallop Abdomen: soft, non-tender; bowel sounds normal; no masses,  no organomegaly Extremities: extremities normal, atraumatic, no cyanosis or edema Pulses: 2+ and symmetric Skin: Skin color, texture, turgor normal. No rashes or lesions Neurologic: Grossly normal Psych: Tearful, emotional about life stresses  EKG: Normal sinus rhythm at 74-personally reviewed  ASSESSMENT: 1. History of palpitations 2. Dyslipidemia - improved on Lipitor 80 mg 3. CAC 0 - (2015) 4. History of depression with significant life stressors 5. Strong family history of coronary disease 6. High LP(a) at 210  PLAN: 1.   Mrs. Yanko denies any chest pain or palpitations.  She is compliant with her atorvastatin.  Would like to recheck her lipids today.  She had a calcium score in 2015.  We would consider possibly repeating that in the next couple years.  I would consider her for the anti-LP(a) study however I think she needs to have had a history of cardiovascular events.  LP(a) does meet the cutoff of greater than 200.  Plan follow-up with me next year.  Mayford Knife, MD, [facility], FACP  [place]  [facility]  Medical Director of the Advanced Lipid Disorders &  Cardiovascular Risk Reduction Clinic Diplomate of the American Board of Clinical Lipidology Attending Cardiologist  Direct Dial: [phone]  Fax: [phone]  Website:  [email or web address] Shamira Toutant 09/13/2020, 8:44  AM

## 2020-09-13 NOTE — Patient Instructions (Addendum)
Medication Instructions:  Your physician recommends that you continue on your current medications as directed. Please refer to the Current Medication list given to you today.  *If you need a refill on your cardiac medications before your next appointment, please call your pharmacy*  Lab Work: LIPID TODAY   If you have labs (blood work) drawn today and your tests are completely normal, you will receive your results only by: Marland Kitchen MyChart Message (if you have MyChart) OR . A paper copy in the mail If you have any lab test that is abnormal or we need to change your treatment, we will call you to review the results.  Testing/Procedures: NONE   Follow-Up: At [facility], you and your health needs are our priority.  As part of our continuing mission to provide you with exceptional heart care, we have created designated Provider Care Teams.  These Care Teams include your primary Cardiologist (physician) and Advanced Practice Providers (APPs -  Physician Assistants and Nurse Practitioners) who all work together to provide you with the care you need, when you need it.  We recommend signing up for the patient portal called "MyChart".  Sign up information is provided on this After Visit Summary.  MyChart is used to connect with patients for Virtual Visits (Telemedicine).  Patients are able to view lab/test results, encounter notes, upcoming appointments, etc.  Non-urgent messages can be sent to your provider as well.   To learn more about what you can do with MyChart, go to [email or web address].    Your next appointment:   12 month(s)    You will receive a reminder letter in the mail two months in advance. If you don't receive a letter, please call our office to schedule the follow-up appointment.  The format for your next appointment:   In Person  Provider:   You may see DR HILTY  or one of the following Advanced Practice Providers on your designated Care Team:    Azalee Course, PA-C  Micah Flesher, PA-C or   Judy Pimple, [place]

## 2020-09-20 ENCOUNTER — Other Ambulatory Visit: Payer: Self-pay | Admitting: *Deleted

## 2020-09-20 MED ORDER — EZETIMIBE 10 MG PO TABS: 10.0000 mg | ORAL_TABLET | Freq: Every day | ORAL | 3 refills | Status: DC

## 2020-09-24 ENCOUNTER — Other Ambulatory Visit: Payer: BLUE CROSS/BLUE SHIELD

## 2020-09-27 ENCOUNTER — Other Ambulatory Visit: Payer: Self-pay | Admitting: Internal Medicine

## 2020-09-30 DIAGNOSIS — S60221A Contusion of right hand, initial encounter: Secondary | ICD-10-CM | POA: Diagnosis not present

## 2020-09-30 DIAGNOSIS — S93492A Sprain of other ligament of left ankle, initial encounter: Secondary | ICD-10-CM | POA: Diagnosis not present

## 2020-10-13 ENCOUNTER — Other Ambulatory Visit: Payer: BLUE CROSS/BLUE SHIELD

## 2020-10-31 DIAGNOSIS — Z20822 Contact with and (suspected) exposure to covid-19: Secondary | ICD-10-CM | POA: Diagnosis not present

## 2020-11-04 DIAGNOSIS — F900 Attention-deficit hyperactivity disorder, predominantly inattentive type: Secondary | ICD-10-CM | POA: Diagnosis not present

## 2020-11-04 DIAGNOSIS — F3342 Major depressive disorder, recurrent, in full remission: Secondary | ICD-10-CM | POA: Diagnosis not present

## 2020-11-04 DIAGNOSIS — F411 Generalized anxiety disorder: Secondary | ICD-10-CM | POA: Diagnosis not present

## 2020-11-06 IMAGING — MG DIGITAL DIAGNOSTIC UNILATERAL RIGHT MAMMOGRAM WITH TOMO AND CAD
6 series · 6 of 14 positions shown · non-contrast
Comparison: [DATE] [DATE], [DATE] [DATE], [DATE]

CLINICAL DATA: 57-year-old patient presents for six-month follow-up
of probably benign right breast calcifications. She is asymptomatic.

EXAM:
DIGITAL DIAGNOSTIC UNILATERAL RIGHT MAMMOGRAM WITH CAD AND TOMO

[R CC]
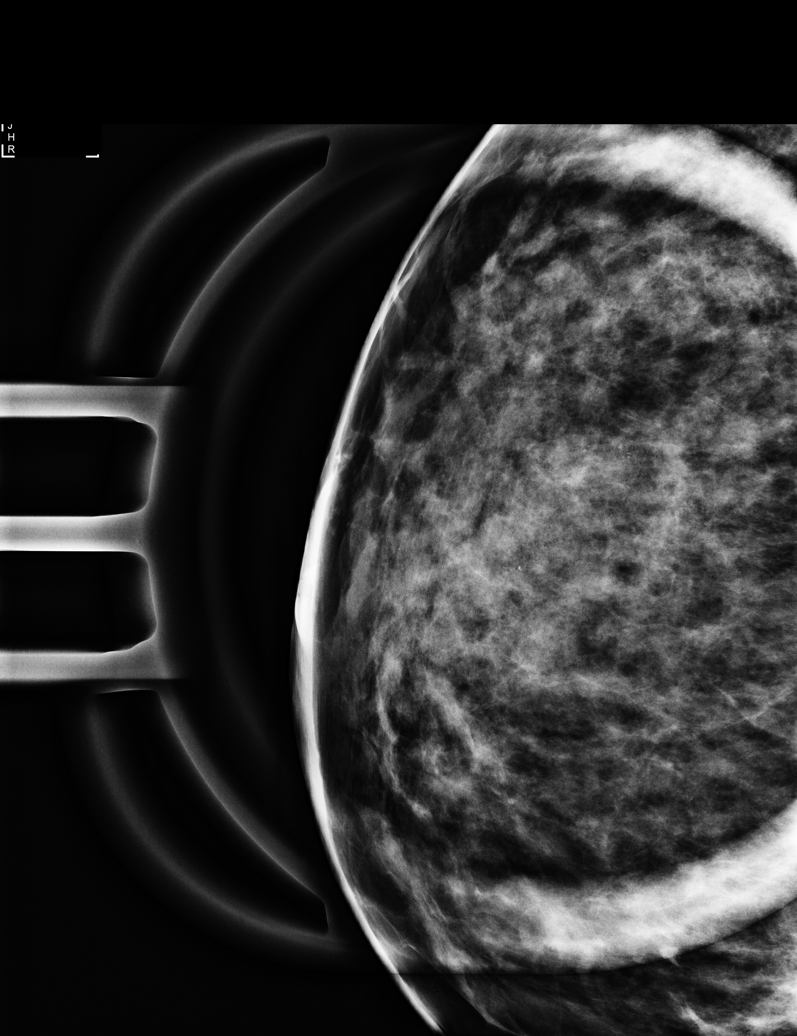

[R ML]
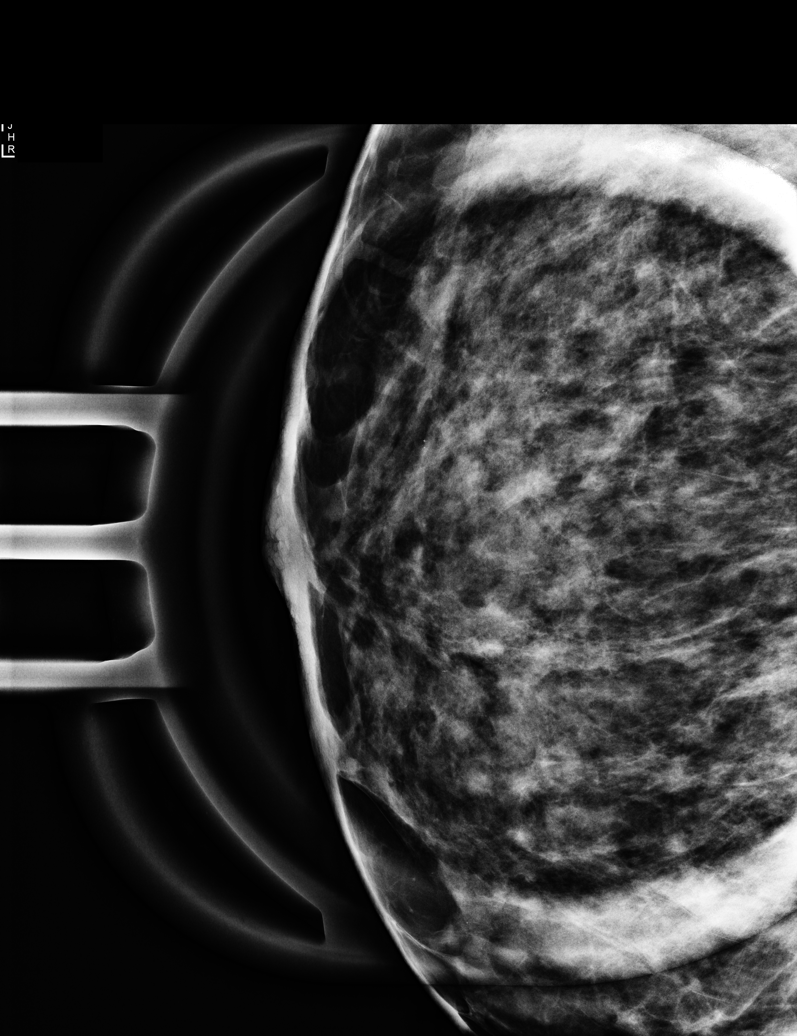

[R CC synth-2D]
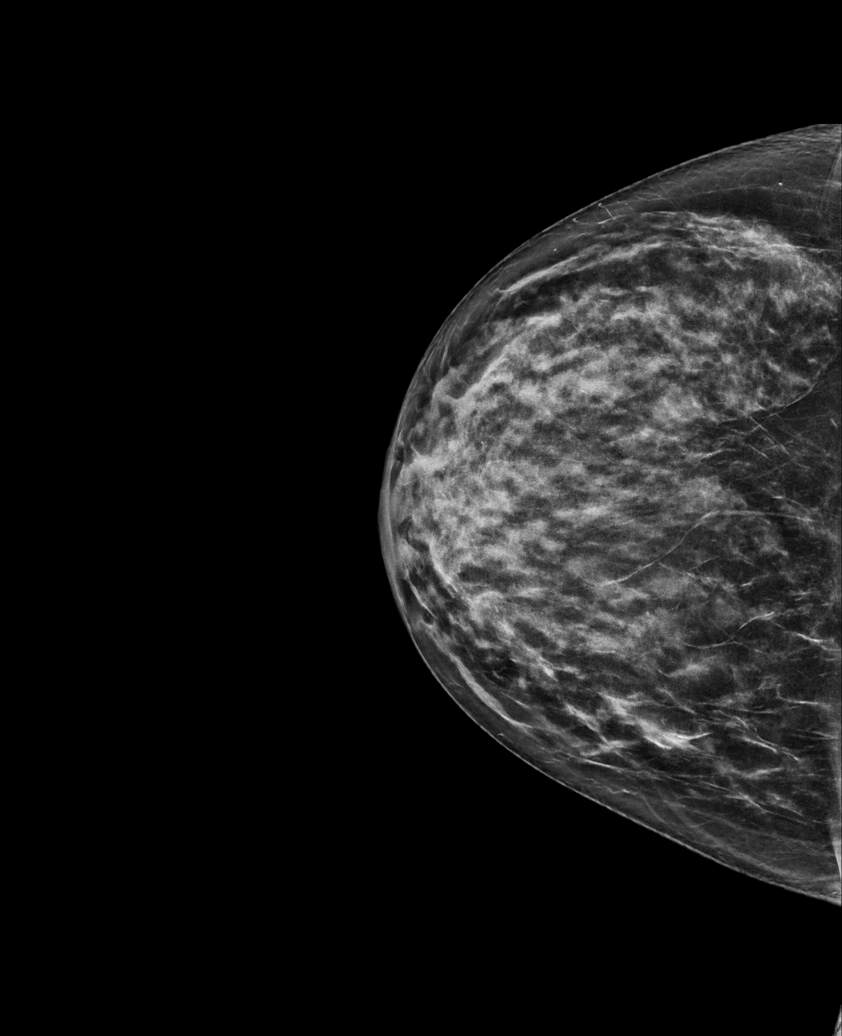

[R MLO synth-2D]
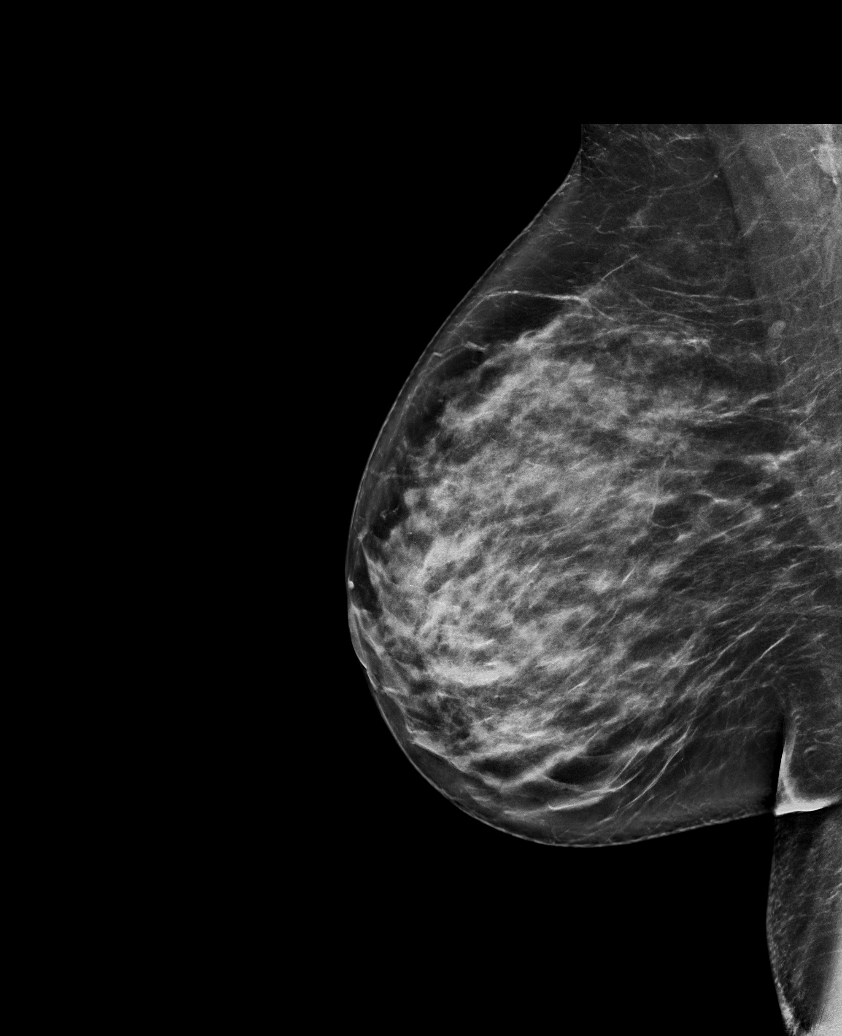

[R MLO tomo · tomo slice 41/82.0]
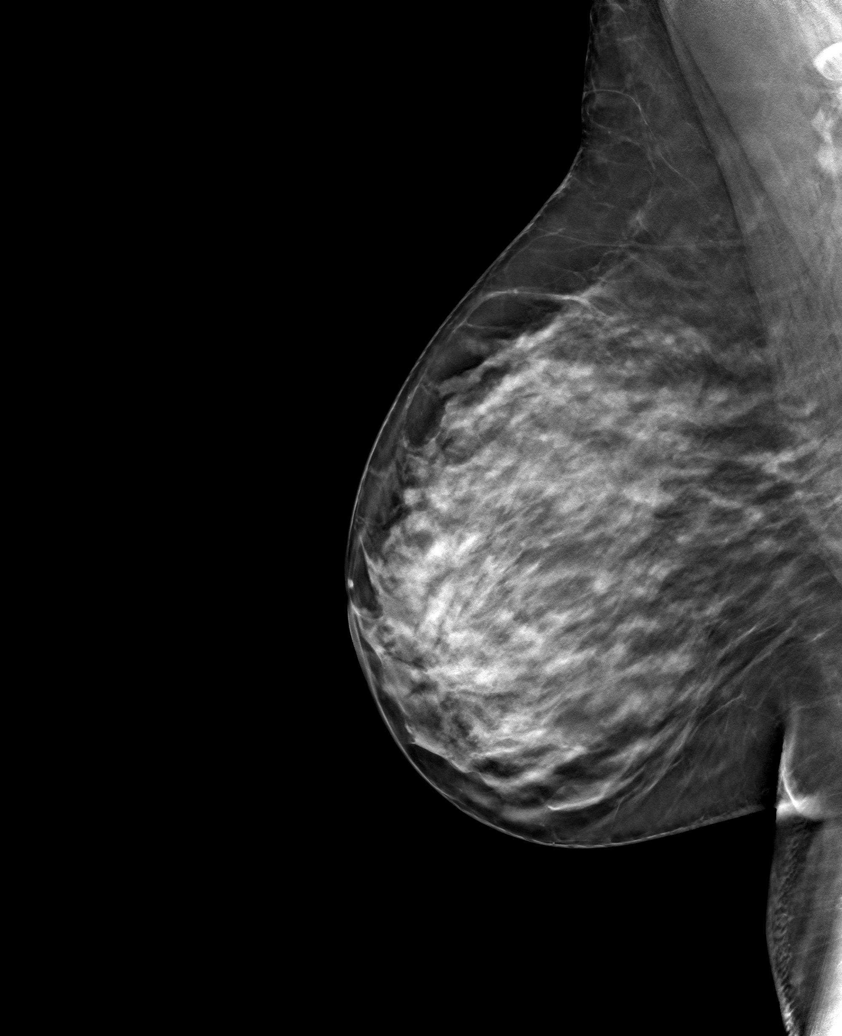

[R CC tomo · tomo slice 36/71.0]
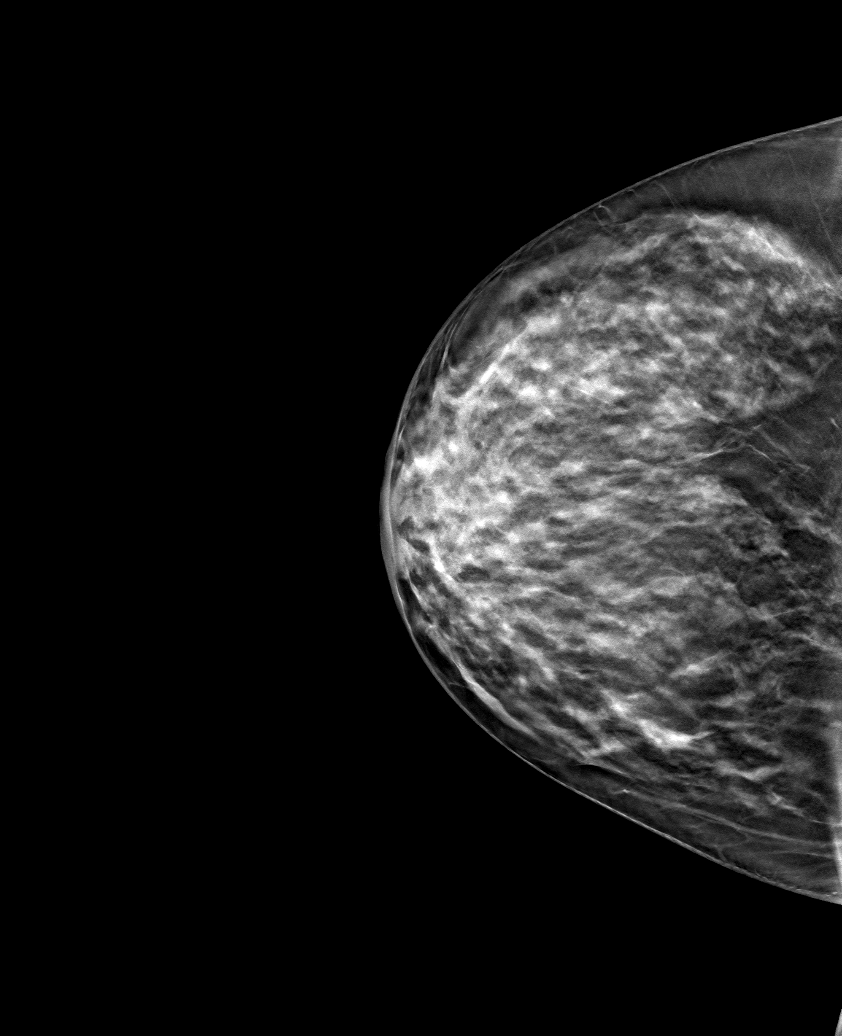

[6 of 14 positions shown; findings below may reference images not displayed]

ACR Breast Density Category c: The breast tissue is heterogeneously
dense, which may obscure small masses.
FINDINGS: Magnification views of the retroareolar right breast show punctate
and loosely grouped calcifications that are unchanged. No new or
suspicious microcalcifications, mass, or architectural distortion is
identified to suggest malignancy.

Mammographic images were processed with CAD.
IMPRESSION: Stable probably benign right breast calcifications.

RECOMMENDATION:
Bilateral diagnostic mammogram with magnification views of the right
breast is recommended in June 2019.

I have discussed the findings and recommendations with the patient.
Results were also provided in writing at the conclusion of the
visit. If applicable, a reminder letter will be sent to the patient
regarding the next appointment.

BI-RADS CATEGORY  3: Probably benign.

## 2020-11-15 ENCOUNTER — Ambulatory Visit
Admission: RE | Admit: 2020-11-15 | Discharge: 2020-11-15 | Disposition: A | Discharge status: Home / Self Care | Payer: BLUE CROSS/BLUE SHIELD | Source: Ambulatory Visit | Attending: Obstetrics and Gynecology | Admitting: Obstetrics and Gynecology

## 2020-11-15 ENCOUNTER — Other Ambulatory Visit: Payer: Self-pay

## 2020-11-15 DIAGNOSIS — Z803 Family history of malignant neoplasm of breast: Secondary | ICD-10-CM

## 2020-11-15 DIAGNOSIS — N6489 Other specified disorders of breast: Secondary | ICD-10-CM | POA: Diagnosis not present

## 2020-11-15 MED ORDER — GADOBUTROL 1 MMOL/ML IV SOLN
9.0000 mL | Freq: Once | INTRAVENOUS | Status: AC | PRN
  Administered 2020-11-15: 9 mL via INTRAVENOUS

## 2020-11-21 HISTORY — PX: AUGMENTATION MAMMAPLASTY: SUR837

## 2021-09-22 ENCOUNTER — Other Ambulatory Visit: Payer: Self-pay | Admitting: Internal Medicine

## 2021-10-25 ENCOUNTER — Other Ambulatory Visit: Payer: Self-pay

## 2021-10-25 MED ORDER — EZETIMIBE 10 MG PO TABS: 10.0000 mg | ORAL_TABLET | Freq: Every day | ORAL | 1 refills | Status: DC

## 2021-10-25 MED ORDER — ATORVASTATIN CALCIUM 80 MG PO TABS: 80.0000 mg | ORAL_TABLET | Freq: Every day | ORAL | 0 refills | Status: DC

## 2021-11-07 ENCOUNTER — Other Ambulatory Visit: Payer: Self-pay | Admitting: Obstetrics and Gynecology

## 2021-11-07 DIAGNOSIS — Z1231 Encounter for screening mammogram for malignant neoplasm of breast: Secondary | ICD-10-CM

## 2021-11-23 ENCOUNTER — Ambulatory Visit
Admission: RE | Admit: 2021-11-23 | Discharge: 2021-11-23 | Disposition: A | Discharge status: Home / Self Care | Payer: BC Managed Care – PPO | Source: Ambulatory Visit | Attending: Obstetrics and Gynecology | Admitting: Obstetrics and Gynecology

## 2021-11-23 DIAGNOSIS — Z1231 Encounter for screening mammogram for malignant neoplasm of breast: Secondary | ICD-10-CM

## 2021-12-25 ENCOUNTER — Telehealth: Payer: Self-pay

## 2021-12-25 ENCOUNTER — Ambulatory Visit
Admission: EM | Admit: 2021-12-25 | Discharge: 2021-12-25 | Disposition: A | Discharge status: Home / Self Care | Payer: BC Managed Care – PPO

## 2021-12-25 DIAGNOSIS — J45901 Unspecified asthma with (acute) exacerbation: Secondary | ICD-10-CM | POA: Diagnosis not present

## 2021-12-25 MED ORDER — PROMETHAZINE-DM 6.25-15 MG/5ML PO SYRP: 5.0000 mL | ORAL_SOLUTION | Freq: Four times a day (QID) | ORAL | 0 refills | Status: DC | PRN

## 2021-12-25 MED ORDER — IBUPROFEN 400 MG PO TABS: 400.0000 mg | ORAL_TABLET | Freq: Three times a day (TID) | ORAL | 0 refills | Status: DC | PRN

## 2021-12-25 MED ORDER — MOMETASONE FUROATE 50 MCG/ACT NA SUSP
2.0000 | Freq: Every day | NASAL | 12 refills | Status: DC
Start: 2021-12-25 — End: 2022-04-09

## 2021-12-25 MED ORDER — GUAIFENESIN 400 MG PO TABS: ORAL_TABLET | ORAL | 0 refills | Status: DC

## 2021-12-25 MED ORDER — AZITHROMYCIN 250 MG PO TABS: ORAL_TABLET | ORAL | 0 refills | Status: AC

## 2021-12-25 MED ORDER — CETIRIZINE HCL 10 MG PO TABS: 10.0000 mg | ORAL_TABLET | Freq: Every day | ORAL | 2 refills | Status: AC

## 2021-12-25 MED ORDER — AZITHROMYCIN 250 MG PO TABS: ORAL_TABLET | ORAL | 0 refills | Status: DC

## 2021-12-25 MED ORDER — CETIRIZINE HCL 10 MG PO TABS: 10.0000 mg | ORAL_TABLET | Freq: Every day | ORAL | 2 refills | Status: DC

## 2021-12-25 MED ORDER — MOMETASONE FUROATE 50 MCG/ACT NA SUSP: 2.0000 | Freq: Every day | NASAL | 12 refills | Status: DC

## 2021-12-25 MED ORDER — TRELEGY ELLIPTA 200-62.5-25 MCG/ACT IN AEPB: 1.0000 | INHALATION_SPRAY | Freq: Every day | RESPIRATORY_TRACT | 0 refills | Status: AC

## 2021-12-25 NOTE — Discharge Instructions (Addendum)
Your symptoms and my physical exam findings are concerning for exacerbation of your underlying allergies that has resulted in bronchitis and, if unaddressed, may evolve into pneumonia.  ? ? ?Please see the list below for recommended medications, dosages and frequencies to provide relief of your current symptoms:   ?  ?Trelegy (fluticasone, vilanterol and umeclidinium):  This inhaled medication contains a corticosteroid and long-acting form of albuterol.  The inhaled steroid and this medication  is not absorbed into the body and will not cause side effects such as increased blood sugar levels, irritability, sleeplessness or weight gain.  Inhaled corticosteroid are sort of like topical steroid creams but, as you can imagine, it is not practical to attempt to rub a steroid cream inside of your lungs.  The long-acting albuterol works similarly to the short acting albuterol found in your rescue inhaler but provides 24-hour relaxation of the smooth muscles that open and constrict your airways; your short acting rescue inhaler can only provide for a few hours this benefit for a few hours.  The third unique ingredient, umeclidinium, is an antimuscarinic and works similarly to your albuterol, providing long-acting relaxation of the smooth muscles in your airway.  Please feel free to continue using your short acting rescue inhaler as often as needed throughout the day for shortness of breath, wheezing, and cough. ? ?Azithromycin (Z-Pak):  take 2 tablets the first day, then take 1 tablet daily every day thereafter until complete. ? ?Zyrtec (cetirizine): This is an excellent second-generation antihistamine that helps to reduce respiratory inflammatory response to environmental allergens.  In some patients, this medication can cause daytime sleepiness so I recommend that you take 1 tablet daily at bedtime.   ?  ?Nasonex (mometasone): This is a steroid nasal spray that you use once daily, 2 sprays in each nare.  This medication  does not work well if you decide to use it only used as you feel you need to, it works best used on a daily basis.  After 3 to 5 days of use, you will notice significant reduction of the inflammation and mucus production that is currently being caused by exposure to allergens, whether seasonal or environmental.  The most common side effect of this medication is nosebleeds.  If you experience a nosebleed, please discontinue use for 1 week, then feel free to resume.  I have provided you with a prescription but you can also purchase this medication over-the-counter if your insurance will not cover it. ?  ?Ibuprofen  (Advil, Motrin): This is a good anti-inflammatory medication which not only addresses aches, pains but also significantly reduces soft tissue inflammation of the upper airways that causes sinus and nasal congestion as well as inflammation of the lower airways which makes you feel like your breathing is constricted or your cough feel tight.  I recommend that you take between 400 to 600 mg every 6-8 hours as needed.    ?   ?Guaifenesin (Robitussin, Mucinex): This is an expectorant.  This helps break up chest congestion and loosen up thick nasal drainage making phlegm and drainage more liquid and therefore easier to remove.  I recommend being 400 mg three times daily as needed.    ?  ?Promethazine DM: Promethazine is both a nasal decongestant and an antinausea medication that makes most patients feel fairly sleepy.  The DM is dextromethorphan, a cough suppressant found in many over-the-counter cough medications.  Please take 5 mL before bedtime to minimize your cough which will help you sleep better.  I have provided you with a prescription for this medication.    ?  ?Conservative care is also recommended at this time.  This includes rest, pushing clear fluids and activity as tolerated.  Warm beverages such as teas and broths versus cold beverages/popsicles and frozen sherbet/sorbet are your choice, both warm  and cold are beneficial.  You may also notice that your appetite is reduced; this is okay as long as you are drinking plenty of clear fluids.  ?  ?Please follow-up within the next 3 to 5 days either with your primary care provider or urgent care if your symptoms do not resolve.  If you do not have a primary care provider, we will assist you in finding one. ?  ?Thank you for visiting urgent care today.  We appreciate the opportunity to participate in your care. ? ?

## 2021-12-25 NOTE — ED Provider Notes (Signed)
[place]    CSN: [id number] Arrival date & time: 12/25/21  1353    HISTORY   Chief Complaint  Patient presents with   Cough   Nasal Congestion   HPI Shannon Gallagher is a 61 y.o. female. Pt c/o allergies, cough, and chest congestion that began last week.  Patient reports a history of seasonal allergies, usually ends up on steroids and antibiotics every spring.  Patient states she she started taking Zyrtec when her symptoms began last week and began Flonase yesterday.  Patient states she also has a prescription for Flovent that she takes for gastroesophageal reflux disease, states she has not considered using it for her cough or the burning sensation in her chest.  Has a mildly elevated temperature on arrival today with an elevated blood pressure.  Oxygen saturations, respirations and heart rate are normal.  Patient states she is coughing so much she feels like she is going to throw up.    The history is provided by the patient.  Past Medical History:  Diagnosis Date   Depression    Dyslipidemia    Positive KIF6 genotype.  Elevated LP(a)   Hyperhomocysteinemia (HCC)    Patient Active Problem List   Diagnosis Date Noted   Palpitations 12/10/2016   Medication management 12/05/2016   Dyslipidemia 08/12/2013   Family history of premature CAD 08/12/2013   Situational mixed anxiety and depressive disorder 08/12/2013   Past Surgical History:  Procedure Laterality Date   2D Echo  05/30/2007   EF > 55%, Trace MR.    AUGMENTATION MAMMAPLASTY Bilateral 11/21/2020   Exercise stress test  07/17/2007   Work load 15 METS.  No CP or EKG changes.   REDUCTION MAMMAPLASTY     OB History   No obstetric history on file.    Home Medications    Prior to Admission medications   Medication Sig Start Date End Date Taking? Authorizing Provider  atorvastatin (LIPITOR) 80 MG tablet Take 1 tablet (80 mg total) by mouth daily. Patient needs to schedule an appointment with Cardiology  before receiving future refills. 10/25/21   Hilty, Nadean Corwin, MD  ezetimibe (ZETIA) 10 MG tablet Take 1 tablet (10 mg total) by mouth daily. Patient need appointment for future refill. 10/25/21   Hilty, Nadean Corwin, MD  VYVANSE 70 MG capsule Take 1 capsule by mouth daily. 12/04/16   [provider]   Family History Family History  Problem Relation Age of Onset   Breast cancer Neg Hx    Social History Social History   Tobacco Use   Smoking status: Never   Smokeless tobacco: Never  Substance Use Topics   Alcohol use: Yes    Comment: couple glasses wine/day   Drug use: No   Allergies   Patient has no known allergies.  Review of Systems Review of Systems Pertinent findings noted in history of present illness.   Physical Exam Triage Vital Signs ED Triage Vitals  Enc Vitals Group     BP 08/11/21 0827 (!) 147/82     Pulse Rate 08/11/21 0827 72     Resp 08/11/21 0827 18     Temp 08/11/21 0827 98.3 F (36.8 C)     Temp Source 08/11/21 0827 Oral     SpO2 08/11/21 0827 98 %     Weight --      Height --      Head Circumference --      Peak Flow --  Pain Score 08/11/21 0826 5     Pain Loc --      Pain Edu? --      Excl. in [place]? --   No data found.  Updated Vital Signs BP (!) 150/98 (BP Location: Left Arm)    Pulse 72    Temp 99.4 F (37.4 C) (Oral)    Resp 18    SpO2 96%   Physical Exam Vitals and nursing note reviewed.  Constitutional:      General: She is not in acute distress.    Appearance: Normal appearance. She is not ill-appearing.  HENT:     Head: Normocephalic and atraumatic.     Salivary Glands: Right salivary gland is not diffusely enlarged or tender. Left salivary gland is not diffusely enlarged or tender.     Right Ear: Tympanic membrane, ear canal and external ear normal. No drainage. No middle ear effusion. There is no impacted cerumen. Tympanic membrane is not injected, erythematous or bulging.     Left Ear: Tympanic membrane, ear canal and  external ear normal. No drainage.  No middle ear effusion. There is no impacted cerumen. Tympanic membrane is not injected, erythematous or bulging.     Ears:     Comments: Bilateral EACs normal, both TMs bulging with clear fluid    Nose: Nose normal. No nasal deformity, septal deviation, signs of injury, nasal tenderness, mucosal edema, congestion or rhinorrhea.     Right Nostril: Occlusion present. No foreign body, epistaxis or septal hematoma.     Left Nostril: Occlusion present. No foreign body, epistaxis or septal hematoma.     Right Turbinates: Not enlarged, swollen or pale.     Left Turbinates: Not enlarged, swollen or pale.     Right Sinus: No maxillary sinus tenderness or frontal sinus tenderness.     Left Sinus: No maxillary sinus tenderness or frontal sinus tenderness.     Mouth/Throat:     Lips: Pink. No lesions.     Mouth: Mucous membranes are moist. No oral lesions.     Pharynx: Oropharynx is clear. Uvula midline. No posterior oropharyngeal erythema or uvula swelling.     Tonsils: No tonsillar exudate. 0 on the right. 0 on the left.     Comments: Postnasal drip Eyes:     General: Lids are normal.        Right eye: No discharge.        Left eye: No discharge.     Extraocular Movements: Extraocular movements intact.     Conjunctiva/sclera: Conjunctivae normal.     Right eye: Right conjunctiva is not injected.     Left eye: Left conjunctiva is not injected.  Neck:     Trachea: Trachea and phonation normal.  Cardiovascular:     Rate and Rhythm: Normal rate and regular rhythm.     Pulses: Normal pulses.     Heart sounds: Normal heart sounds. No murmur heard.   No friction rub. No gallop.  Pulmonary:     Effort: Pulmonary effort is normal. No accessory muscle usage, prolonged expiration or respiratory distress.     Breath sounds: Normal breath sounds. No stridor, decreased air movement or transmitted upper airway sounds. No decreased breath sounds, wheezing, rhonchi or rales.      Comments: Coarse breath sounds throughout that clear with deep cough. Chest:     Chest wall: No tenderness.  Musculoskeletal:        General: Normal range of motion.     Cervical back:  Normal range of motion and neck supple. Normal range of motion.  Lymphadenopathy:     Cervical: No cervical adenopathy.  Skin:    General: Skin is warm and dry.     Findings: No erythema or rash.  Neurological:     General: No focal deficit present.     Mental Status: She is alert and oriented to person, place, and time.  Psychiatric:        Mood and Affect: Mood normal.        Behavior: Behavior normal.    Visual Acuity Right Eye Distance:   Left Eye Distance:   Bilateral Distance:    Right Eye Near:   Left Eye Near:    Bilateral Near:     UC Couse / Diagnostics / Procedures:    EKG  Radiology No results found.  Procedures Procedures (including critical care time)  UC Diagnoses / Final Clinical Impressions(s)   I have reviewed the triage vital signs and the nursing notes.  Pertinent labs & imaging results that were available during my care of the patient were reviewed by me and considered in my medical decision making (see chart for details).   Final diagnoses:  Allergic bronchitis with acute exacerbation   Patient provided with a Z-Pak and Trelegy for acute bronchitis that may possibly be evolving into pneumonia given duration of symptoms.  Patient also advised to use Flonase and cetirizine throughout spring season to avoid recurrent respiratory functions.  Mucinex provided to help bronchitis, Promethazine DM for nighttime cough.  Ibuprofen recommended and lieu of steroids.  ED Prescriptions     Medication Sig Dispense Auth. Provider   Fluticasone-Umeclidin-Vilant (TRELEGY ELLIPTA) 200-62.5-25 MCG/ACT AEPB Inhale 1 puff into the lungs daily for 14 days. 1 each Lynden Oxford Scales, PA-C   azithromycin (ZITHROMAX) 250 MG tablet Take 2 tablets (500 mg total) by mouth daily for  1 day, THEN 1 tablet (250 mg total) daily for 4 days. 6 tablet Lynden Oxford Scales, PA-C   ibuprofen (ADVIL) 400 MG tablet Take 1 tablet (400 mg total) by mouth every 8 (eight) hours as needed for up to 30 doses. 30 tablet Lynden Oxford Scales, PA-C   promethazine-dextromethorphan (PROMETHAZINE-DM) 6.25-15 MG/5ML syrup Take 5 mLs by mouth 4 (four) times daily as needed for cough. 180 mL Lynden Oxford Scales, PA-C   guaifenesin (HUMIBID E) 400 MG TABS tablet Take 1 tablet 3 times daily as needed for chest congestion and cough 21 tablet Lynden Oxford Scales, PA-C   mometasone (NASONEX) 50 MCG/ACT nasal spray Place 2 sprays into the nose daily. 1 each Lynden Oxford Scales, PA-C   cetirizine (ZYRTEC ALLERGY) 10 MG tablet Take 1 tablet (10 mg total) by mouth at bedtime. 30 tablet Lynden Oxford Scales, PA-C      PDMP not reviewed this encounter.  Pending results:  Labs Reviewed - No data to display  Medications Ordered in UC: Medications - No data to display  Disposition Upon Discharge:  Condition: stable for discharge home Home: take medications as prescribed; routine discharge instructions as discussed; follow up as advised.  Patient presented with an acute illness with associated systemic symptoms and significant discomfort requiring urgent management. In my opinion, this is a condition that a prudent lay person (someone who possesses an average knowledge of health and medicine) may potentially expect to result in complications if not addressed urgently such as respiratory distress, impairment of bodily function or dysfunction of bodily organs.   Routine symptom specific, illness specific and/or disease  specific instructions were discussed with the patient and/or caregiver at length.   As such, the patient has been evaluated and assessed, work-up was performed and treatment was provided in alignment with urgent care protocols and evidence based medicine.  Patient/parent/caregiver  has been advised that the patient may require follow up for further testing and treatment if the symptoms continue in spite of treatment, as clinically indicated and appropriate.  If the patient was tested for COVID-19, Influenza and/or RSV, then the patient/parent/guardian was advised to isolate at home pending the results of his/her diagnostic coronavirus test and potentially longer if theyre positive. I have also advised pt that if his/her COVID-19 test returns positive, it's recommended to self-isolate for at least 10 days after symptoms first appeared AND until fever-free for 24 hours without fever reducer AND other symptoms have improved or resolved. Discussed self-isolation recommendations as well as instructions for household member/close contacts as per the [facility] and [place] DHHS, and also gave patient the [place] packet with this information.  Patient/parent/caregiver has been advised to return to the [facility] or PCP in 3-5 days if no better; to PCP or the Emergency Department if new signs and symptoms develop, or if the current signs or symptoms continue to change or worsen for further workup, evaluation and treatment as clinically indicated and appropriate  The patient will follow up with their current PCP if and as advised. If the patient does not currently have a PCP we will assist them in obtaining one.   The patient may need specialty follow up if the symptoms continue, in spite of conservative treatment and management, for further workup, evaluation, consultation and treatment as clinically indicated and appropriate.  Patient/parent/caregiver verbalized understanding and agreement of plan as discussed.  All questions were addressed during visit.  Please see discharge instructions below for further details of plan.  Discharge Instructions:   Discharge Instructions      Your symptoms and my physical exam findings are concerning for exacerbation of your underlying allergies that has resulted in  bronchitis and, if unaddressed, may evolve into pneumonia.    Please see the list below for recommended medications, dosages and frequencies to provide relief of your current symptoms:     Trelegy (fluticasone, vilanterol and umeclidinium):  This inhaled medication contains a corticosteroid and long-acting form of albuterol.  The inhaled steroid and this medication  is not absorbed into the body and will not cause side effects such as increased blood sugar levels, irritability, sleeplessness or weight gain.  Inhaled corticosteroid are sort of like topical steroid creams but, as you can imagine, it is not practical to attempt to rub a steroid cream inside of your lungs.  The long-acting albuterol works similarly to the short acting albuterol found in your rescue inhaler but provides 24-hour relaxation of the smooth muscles that open and constrict your airways; your short acting rescue inhaler can only provide for a few hours this benefit for a few hours.  The third unique ingredient, umeclidinium, is an antimuscarinic and works similarly to your albuterol, providing long-acting relaxation of the smooth muscles in your airway.  Please feel free to continue using your short acting rescue inhaler as often as needed throughout the day for shortness of breath, wheezing, and cough.  Azithromycin (Z-Pak):  take 2 tablets the first day, then take 1 tablet daily every day thereafter until complete.  Zyrtec (cetirizine): This is an excellent second-generation antihistamine that helps to reduce respiratory inflammatory response to environmental allergens.  In some  patients, this medication can cause daytime sleepiness so I recommend that you take 1 tablet daily at bedtime.     Nasonex (mometasone): This is a steroid nasal spray that you use once daily, 2 sprays in each nare.  This medication does not work well if you decide to use it only used as you feel you need to, it works best used on a daily basis.  After 3 to  5 days of use, you will notice significant reduction of the inflammation and mucus production that is currently being caused by exposure to allergens, whether seasonal or environmental.  The most common side effect of this medication is nosebleeds.  If you experience a nosebleed, please discontinue use for 1 week, then feel free to resume.  I have provided you with a prescription but you can also purchase this medication over-the-counter if your insurance will not cover it.   Ibuprofen  (Advil, Motrin): This is a good anti-inflammatory medication which not only addresses aches, pains but also significantly reduces soft tissue inflammation of the upper airways that causes sinus and nasal congestion as well as inflammation of the lower airways which makes you feel like your breathing is constricted or your cough feel tight.  I recommend that you take between 400 to 600 mg every 6-8 hours as needed.       Guaifenesin (Robitussin, Mucinex): This is an expectorant.  This helps break up chest congestion and loosen up thick nasal drainage making phlegm and drainage more liquid and therefore easier to remove.  I recommend being 400 mg three times daily as needed.      Promethazine DM: Promethazine is both a nasal decongestant and an antinausea medication that makes most patients feel fairly sleepy.  The DM is dextromethorphan, a cough suppressant found in many over-the-counter cough medications.  Please take 5 mL before bedtime to minimize your cough which will help you sleep better.  I have provided you with a prescription for this medication.      Conservative care is also recommended at this time.  This includes rest, pushing clear fluids and activity as tolerated.  Warm beverages such as teas and broths versus cold beverages/popsicles and frozen sherbet/sorbet are your choice, both warm and cold are beneficial.  You may also notice that your appetite is reduced; this is okay as long as you are drinking plenty of  clear fluids.    Please follow-up within the next 3 to 5 days either with your primary care provider or urgent care if your symptoms do not resolve.  If you do not have a primary care provider, we will assist you in finding one.   Thank you for visiting urgent care today.  We appreciate the opportunity to participate in your care.     This office note has been dictated using Museum/gallery curator.  Unfortunately, and despite my best efforts, this method of dictation can sometimes lead to occasional typographical or grammatical errors.  I apologize in advance if this occurs.     Lynden Oxford Scales, PA-C 12/25/21 1745

## 2021-12-25 NOTE — ED Triage Notes (Signed)
Pt c/o allergies, cough, and chest congestion that began last week. ?

## 2022-01-05 ENCOUNTER — Encounter: Payer: Self-pay | Admitting: Internal Medicine

## 2022-01-05 ENCOUNTER — Other Ambulatory Visit: Payer: Self-pay

## 2022-01-05 ENCOUNTER — Ambulatory Visit (INDEPENDENT_AMBULATORY_CARE_PROVIDER_SITE_OTHER): Payer: BC Managed Care – PPO | Admitting: Internal Medicine

## 2022-01-05 VITALS — BP 164/100 | HR 78 | Ht 66.0 in | Wt 174.0 lb

## 2022-01-05 DIAGNOSIS — I10 Essential (primary) hypertension: Secondary | ICD-10-CM

## 2022-01-05 DIAGNOSIS — Z79899 Other long term (current) drug therapy: Secondary | ICD-10-CM | POA: Diagnosis not present

## 2022-01-05 DIAGNOSIS — E7849 Other hyperlipidemia: Secondary | ICD-10-CM

## 2022-01-05 DIAGNOSIS — E7841 Elevated Lipoprotein(a): Secondary | ICD-10-CM | POA: Diagnosis not present

## 2022-01-05 DIAGNOSIS — R002 Palpitations: Secondary | ICD-10-CM

## 2022-01-05 MED ORDER — EZETIMIBE 10 MG PO TABS: 10.0000 mg | ORAL_TABLET | Freq: Every day | ORAL | 3 refills | Status: DC

## 2022-01-05 MED ORDER — ATORVASTATIN CALCIUM 80 MG PO TABS: 80.0000 mg | ORAL_TABLET | Freq: Every day | ORAL | 3 refills | Status: DC

## 2022-01-05 MED ORDER — TELMISARTAN 40 MG PO TABS: 40.0000 mg | ORAL_TABLET | Freq: Every day | ORAL | 3 refills | Status: DC

## 2022-01-05 NOTE — Patient Instructions (Signed)
Medication Instructions:  ?START telmisartan 40mg  daily -- for BP ? ?*If you need a refill on your cardiac medications before your next appointment, please call your pharmacy* ? ? ?Lab Work: ?NON-FASTING BMET in 2 weeks (after starting new BP) ? ?FASTING LIPID PANEL in about 3 months (before next visit with Dr. ) ? ?If you have labs (blood work) drawn today and your tests are completely normal, you will receive your results only by: ?MyChart Message (if you have MyChart) OR ?A paper copy in the mail ?If you have any lab test that is abnormal or we need to change your treatment, we will call you to review the results. ? ? ?Follow-Up: ?At [facility], you and your health needs are our priority.  As part of our continuing mission to provide you with exceptional heart care, we have created designated Provider Care Teams.  These Care Teams include your primary Cardiologist (physician) and Advanced Practice Providers (APPs -  Physician Assistants and Nurse Practitioners) who all work together to provide you with the care you need, when you need it. ? ?We recommend signing up for the patient portal called "MyChart".  Sign up information is provided on this After Visit Summary.  MyChart is used to connect with patients for Virtual Visits (Telemedicine).  Patients are able to view lab/test results, encounter notes, upcoming appointments, etc.  Non-urgent messages can be sent to your provider as well.   ?To learn more about what you can do with MyChart, go to [facility].   ? ?Your next appointment:   ?3-4 month(s) ? ?The format for your next appointment:   ?In Person ? ?Provider:   ?[email or web address] ? ?Other Instructions: ? ?To monitor for palpations: ?- Apple Watch ?- KardiaMobile by Rennis Golden ? ?

## 2022-01-05 NOTE — Progress Notes (Signed)
? ? ?OFFICE NOTE ? ?Chief Complaint:  ?Hypertension ? ?Primary Care Physician: ?Patient, No Pcp Per (Inactive) ? ?HPI:  ?Shannon Gallagher is a 61 year old female with a history of depression, family history of coronary disease, and an abnormal [place] study showing a KIF6 positive genotype, as well as a hyperhomocysteinemia. She also had an elevated lipoprotein, Lp(a), and an improved dyslipidemia on Lipitor 80 mg daily. She also takes Wellbutrin and Prozac. She is asymptomatic; however, has gained weight, is up to 184 and laboratory work last year demonstrated total cholesterol of 196 and LDL of 116. As mentioned has significant life stressors and decided after concerns with possible muscle aches and memory issues to discontinue Lipitor. She's been off that for several months and recently had a repeat lipid profile which was significantly abnormal. Her total cholesterol was 314, triglycerides 183, HDL 58 and LDL 219. She remains asymptomatic, however is at significant risk given her abnormal cholesterol profile. ? ?Mrs. Shannon Gallagher returns today for followup. She says that this past year has been particularly difficult. Her husband, an Buyer, retail had filed for divorce. She has 2 children at [facility] state and it is a difficult time. She's been under significant amount of stress. Her diet has not been as good as it could be. She did have recent lipid profile drawn which shows a total cholesterol of 229, LDL particle number of 1410, LDL calculated 135, HDL calculated 66 and triglycerides 140. This is improved and she is currently however on full dose Crestor 40 mg daily. She says this medication does give her cramps particularly in her hamstrings at night.  Unfortunately her cholesterol is still not at goal based on her strong family history of coronary disease. As she is on maximal tolerated statin therapy, she may be a candidate for a PCSK9 inhibitor.  However, we do not have evidence that she has existing coronary  disease. ? ?12/10/2016 ? ?Mrs. Shannon Gallagher returns today for follow-up. It's been about 2-1/2 years since I saw her in the office. She returns today for follow-up and is complaining of palpitations. At the time she had filed for divorce but however this is not totally settled and there is an [place] question that remains. She is under significant stress from this. She's recently been having some more palpitations. She does see a psychiatrist, Dr. 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 She also likely has ADD and recently started [facility] for treatment of that. In addition, she was given some Xanax for anxiety. She reports her palpitations are worse particularly when getting bad news, communications from her ex-husband or communications from the attorney. She said they tend to be very short-lived. Sometimes Xanax actually helps improve the palpitations. The frequency of these palpitations have interestingly increased somewhat by my observation after starting on ADD medication. She denies any chest pain. As a reminder she underwent a coronary artery calcium score in 2015 which showed 0 coronary calcium. Despite this she had somewhat of an elevated lipid profile and a strong family history of premature coronary disease in her father. She was intolerant of statins but was ultimately placed on atorvastatin 80 mg and has had a marked improvement in her lipid profile. Her recent lipid profile from 222/18 showed total cholesterol 184, HDL-C 66, LDL-C101, triglycerides 85, non-HDL cholesterol of 118. This is on therapy. According to current guidelines, one could argue given her 0 coronary calcium score her risk is very low and that statin  therapy may not even be indicated, although her cholesterol is expected to be much higher based on her response to high potency statin therapy. I did calculate a Mesa risk score. Given her traditional risk factors her 10 year risk of  CHD event is only 2%, and given her 0 calcium score at risk lowered to 1.4% which is similar to the general population given her age. ? ?07/04/2018 ? ?Ms. Shannon Gallagher returns today for follow-up.  Overall she seems to be doing well.  She said this last year has been tough as she had torn her meniscus in her right knee.  This required arthroscopic procedure.  She has been a little slow to recover from that.  She has had some fluctuation in her weight although she is only slightly up in weight today.  Her LDL cholesterol however has increased from 101 up to 133.  She reports compliance with atorvastatin but says that her diet has not been necessarily is clean as it had been last year.  In addition her exercise is decreased significantly.  It is known that she has a 0 coronary artery calcium score, however her LDL cholesterol still quite high given the fact she is on high intensity statin, suggesting the possibility of an inherited dyslipidemia.  Arguing against this however is no early onset coronary disease in the family despite the fact that there is a high number of individuals with elevated cholesterol.  Based on this I would favor continued therapy with at least 50% reduction in LDL. ? ?08/28/2019 ? ?Shannon Gallagher returns today for follow-up.  Overall she continues to do well.  She says she is not exercising as much as she had been recently.  Her daughter is now living with her during Covid.  She has plans of going on to graduate school.  Cholesterol was recently reassessed.  It seems fairly stable total 227, triglycerides 99, HDL 72 and LDL 138.  In the past she had had lipoprotein particle testing which was in the 1400s and small LDL P was a little elevated.  She apparently also had an elevated LP(a), however I cannot find that initial test which was performed by Dr.Solomon prior to me assuming her care in 2013.  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 ? ?09/13/2020 ? ?Shannon Gallagher seen today for follow-up.   She continues to do very well.  She is contemplating switching jobs.  She is not had repeat lipid testing.  She did have a high LP(a) however at 210 which is a significant risk factor for early onset heart disease.  Unfortunately the atorvastatin is not affecting that.  If she consider her lipids without the atorvastatin, her LDL cholesterol is probably much higher likely over 190 suggestive of familial hyperlipidemia.  There is a strong history of family heart disease.  We did discuss today the possibility of her may be being a candidate for anti-LP(a) study. ? ?01/05/2022 ? ?Sophea returns today for follow-up.  She recently had a bronchitis.  Back in January she said she was having some more frequent palpitations.  She noted that her blood pressure has been elevated.  When she was in urgent care was in the 150s over 100.  Today blood pressure 164/100.  She feels like this is causing her some concern and would like to have it addressed.  She reports compliance with both atorvastatin and ezetimibe.  Her cholesterol has come down with total 188, triglycerides 110, HDL 78 and LDL 91 as of  July 2022. ? ?PMHx:  ?Past Medical History:  ?Diagnosis Date  ? Depression   ? Dyslipidemia   ? Positive KIF6 genotype.  Elevated LP(a)  ? Hyperhomocysteinemia (HCC)   ? ? ?Past Surgical History:  ?Procedure Laterality Date  ? 2D Echo  05/30/2007  ? EF > 55%, Trace MR.   ? AUGMENTATION MAMMAPLASTY Bilateral 11/21/2020  ? Exercise stress test  07/17/2007  ? Work load 15 METS.  No CP or EKG changes.  ? REDUCTION MAMMAPLASTY    ? ? ?FAMHx:  ?Family History  ?Problem Relation Age of Onset  ? Breast cancer Neg Hx   ? ? ?SOCHx:  ? reports that she has never smoked. She has never used smokeless tobacco. She reports current alcohol use. She reports that she does not use drugs. ? ?ALLERGIES:  ?No Known Allergies ? ?ROS: ?Pertinent items noted in HPI and remainder of comprehensive ROS otherwise negative. ? ?HOME MEDS: ?Current Outpatient  Medications  ?Medication Sig Dispense Refill  ? atorvastatin (LIPITOR) 80 MG tablet Take 1 tablet (80 mg total) by mouth daily. Patient needs to schedule an appointment with Cardiology before receiving future refills

## 2022-02-24 LAB — BASIC METABOLIC PANEL
BUN/Creatinine Ratio: 16 (ref 9–23)
BUN: 17 mg/dL (ref 6–24)
CO2: 24 mmol/L (ref 20–29)
Calcium: 10.1 mg/dL (ref 8.7–10.2)
Chloride: 102 mmol/L (ref 96–106)
Creatinine, Ser: 1.08 mg/dL — ABNORMAL HIGH (ref 0.57–1.00)
Glucose: 128 mg/dL — ABNORMAL HIGH (ref 70–99)
Potassium: 4.1 mmol/L (ref 3.5–5.2)
Sodium: 141 mmol/L (ref 134–144)
eGFR: 60 mL/min/{1.73_m2} (ref >59)

## 2022-04-09 ENCOUNTER — Ambulatory Visit (INDEPENDENT_AMBULATORY_CARE_PROVIDER_SITE_OTHER): Payer: BC Managed Care – PPO | Admitting: Internal Medicine

## 2022-04-09 ENCOUNTER — Encounter: Payer: Self-pay | Admitting: Internal Medicine

## 2022-04-09 ENCOUNTER — Ambulatory Visit: Payer: BLUE CROSS/BLUE SHIELD | Admitting: Internal Medicine

## 2022-04-09 VITALS — BP 132/88 | HR 75 | Ht 65.0 in | Wt 176.6 lb

## 2022-04-09 DIAGNOSIS — E7841 Elevated Lipoprotein(a): Secondary | ICD-10-CM

## 2022-04-09 DIAGNOSIS — E7849 Other hyperlipidemia: Secondary | ICD-10-CM

## 2022-04-09 DIAGNOSIS — I10 Essential (primary) hypertension: Secondary | ICD-10-CM

## 2022-04-09 LAB — BASIC METABOLIC PANEL
BUN/Creatinine Ratio: 19 (ref 9–23)
BUN: 16 mg/dL (ref 6–24)
CO2: 25 mmol/L (ref 20–29)
Calcium: 9.2 mg/dL (ref 8.7–10.2)
Chloride: 104 mmol/L (ref 96–106)
Creatinine, Ser: 0.86 mg/dL (ref 0.57–1.00)
Glucose: 103 mg/dL — ABNORMAL HIGH (ref 70–99)
Potassium: 4.9 mmol/L (ref 3.5–5.2)
Sodium: 141 mmol/L (ref 134–144)
eGFR: 78 mL/min/{1.73_m2} (ref >59)

## 2022-04-09 LAB — LIPID PANEL
Chol/HDL Ratio: 2.6 ratio (ref 0.0–4.4)
Cholesterol, Total: 171 mg/dL (ref 100–199)
HDL: 65 mg/dL (ref >39)
LDL Chol Calc (NIH): 83 mg/dL (ref 0–99)
Triglycerides: 131 mg/dL (ref 0–149)
VLDL Cholesterol Cal: 23 mg/dL (ref 5–40)

## 2022-04-09 NOTE — Progress Notes (Signed)
OFFICE NOTE  Chief Complaint:  Follow-up hypertension  Primary Care Physician: Eartha Inch, MD  HPI:  Shannon Gallagher is a 61 year old female with a history of depression, family history of coronary disease, and an abnormal [place] study showing a KIF6 positive genotype, as well as a hyperhomocysteinemia. She also had an elevated lipoprotein, Lp(a), and an improved dyslipidemia on Lipitor 80 mg daily. She also takes Wellbutrin and Prozac. She is asymptomatic; however, has gained weight, is up to 184 and laboratory work last year demonstrated total cholesterol of 196 and LDL of 116. As mentioned has significant life stressors and decided after concerns with possible muscle aches and memory issues to discontinue Lipitor. She's been off that for several months and recently had a repeat lipid profile which was significantly abnormal. Her total cholesterol was 314, triglycerides 183, HDL 58 and LDL 219. She remains asymptomatic, however is at significant risk given her abnormal cholesterol profile.  Shannon Gallagher returns today for followup. She says that this past year has been particularly difficult. Her husband, an Buyer, retail had filed for divorce. She has 2 children at [facility] state and it is a difficult time. She's been under significant amount of stress. Her diet has not been as good as it could be. She did have recent lipid profile drawn which shows a total cholesterol of 229, LDL particle number of 1410, LDL calculated 135, HDL calculated 66 and triglycerides 140. This is improved and she is currently however on full dose Crestor 40 mg daily. She says this medication does give her cramps particularly in her hamstrings at night.  Unfortunately her cholesterol is still not at goal based on her strong family history of coronary disease. As she is on maximal tolerated statin therapy, she may be a candidate for a PCSK9 inhibitor.  However, we do not have evidence that she has existing coronary  disease.  12/10/2016  Shannon Gallagher returns today for follow-up. It's been about 2-1/2 years since I saw her in the office. She returns today for follow-up and is complaining of palpitations. At the time she had filed for divorce but however this is not totally settled and there is an [place] question that remains. She is under significant stress from this. She's recently been having some more palpitations. She does see a psychiatrist, Dr. 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 She also likely has ADD and recently started [facility] for treatment of that. In addition, she was given some Xanax for anxiety. She reports her palpitations are worse particularly when getting bad news, communications from her ex-husband or communications from the attorney. She said they tend to be very short-lived. Sometimes Xanax actually helps improve the palpitations. The frequency of these palpitations have interestingly increased somewhat by my observation after starting on ADD medication. She denies any chest pain. As a reminder she underwent a coronary artery calcium score in 2015 which showed 0 coronary calcium. Despite this she had somewhat of an elevated lipid profile and a strong family history of premature coronary disease in her father. She was intolerant of statins but was ultimately placed on atorvastatin 80 mg and has had a marked improvement in her lipid profile. Her recent lipid profile from 222/18 showed total cholesterol 184, HDL-C 66, LDL-C101, triglycerides 85, non-HDL cholesterol of 118. This is on therapy. According to current guidelines, one could argue given her 0 coronary calcium score her risk is very low and that statin  therapy may not even be indicated, although her cholesterol is expected to be much higher based on her response to high potency statin therapy. I did calculate a Mesa risk score. Given her traditional risk factors her 10 year risk of  CHD event is only 2%, and given her 0 calcium score at risk lowered to 1.4% which is similar to the general population given her age.  07/04/2018  Shannon Gallagher returns today for follow-up.  Overall she seems to be doing well.  She said this last year has been tough as she had torn her meniscus in her right knee.  This required arthroscopic procedure.  She has been a little slow to recover from that.  She has had some fluctuation in her weight although she is only slightly up in weight today.  Her LDL cholesterol however has increased from 101 up to 133.  She reports compliance with atorvastatin but says that her diet has not been necessarily is clean as it had been last year.  In addition her exercise is decreased significantly.  It is known that she has a 0 coronary artery calcium score, however her LDL cholesterol still quite high given the fact she is on high intensity statin, suggesting the possibility of an inherited dyslipidemia.  Arguing against this however is no early onset coronary disease in the family despite the fact that there is a high number of individuals with elevated cholesterol.  Based on this I would favor continued therapy with at least 50% reduction in LDL.  08/28/2019  Shannon Gallagher returns today for follow-up.  Overall she continues to do well.  She says she is not exercising as much as she had been recently.  Her daughter is now living with her during Covid.  She has plans of going on to graduate school.  Cholesterol was recently reassessed.  It seems fairly stable total 227, triglycerides 99, HDL 72 and LDL 138.  In the past she had had lipoprotein particle testing which was in the 1400s and small LDL P was a little elevated.  She apparently also had an elevated LP(a), however I cannot find that initial test which was performed by Dr.Solomon prior to me assuming her care in 2013.  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  09/13/2020  Shannon Gallagher seen today for follow-up.   She continues to do very well.  She is contemplating switching jobs.  She is not had repeat lipid testing.  She did have a high LP(a) however at 210 which is a significant risk factor for early onset heart disease.  Unfortunately the atorvastatin is not affecting that.  If she consider her lipids without the atorvastatin, her LDL cholesterol is probably much higher likely over 190 suggestive of familial hyperlipidemia.  There is a strong history of family heart disease.  We did discuss today the possibility of her may be being a candidate for anti-LP(a) study.  01/05/2022  Shannon Gallagher returns today for follow-up.  She recently had a bronchitis.  Back in January she said she was having some more frequent palpitations.  She noted that her blood pressure has been elevated.  When she was in urgent care was in the 150s over 100.  Today blood pressure 164/100.  She feels like this is causing her some concern and would like to have it addressed.  She reports compliance with both atorvastatin and ezetimibe.  Her cholesterol has come down with total 188, triglycerides 110, HDL 78 and LDL 91 as of  July 2022.  04/09/2022  Shannon Gallagher is seen today in follow-up.  Overall she says she is doing pretty well.  Her blood pressure is much improved on telmisartan.  She had a lot more life stressors but feels like the medicine has helped her feel better.  She did have a small increase in creatinine when it was last checked about a month ago.  I like to recheck her metabolic profile again and I have encouraged hydration.  She is also due for repeat lipid profile.  PMHx:  Past Medical History:  Diagnosis Date   Depression    Dyslipidemia    Positive KIF6 genotype.  Elevated LP(a)   Hyperhomocysteinemia (HCC)     Past Surgical History:  Procedure Laterality Date   2D Echo  05/30/2007   EF > 55%, Trace MR.    AUGMENTATION MAMMAPLASTY Bilateral 11/21/2020   Exercise stress test  07/17/2007   Work load 15 METS.  No CP or EKG  changes.   REDUCTION MAMMAPLASTY      FAMHx:  Family History  Problem Relation Age of Onset   Breast cancer Neg Hx     SOCHx:   reports that she has never smoked. She has never used smokeless tobacco. She reports current alcohol use. She reports that she does not use drugs.  ALLERGIES:  No Known Allergies  ROS: Pertinent items noted in HPI and remainder of comprehensive ROS otherwise negative.  HOME MEDS: Current Outpatient Medications  Medication Sig Dispense Refill   atorvastatin (LIPITOR) 80 MG tablet Take 1 tablet (80 mg total) by mouth daily. 90 tablet 3   buPROPion (WELLBUTRIN XL) 150 MG 24 hr tablet Take 150 mg by mouth daily.     cetirizine (ZYRTEC ALLERGY) 10 MG tablet Take 1 tablet (10 mg total) by mouth at bedtime. 30 tablet 2   diclofenac (VOLTAREN) 50 MG EC tablet Take 50 mg by mouth 2 (two) times daily with a meal.     EUFLEXXA 20 MG/2ML SOSY SMARTSIG:1 Syringe(s) I-ARTIC Once a Week     ezetimibe (ZETIA) 10 MG tablet Take 1 tablet (10 mg total) by mouth daily. 90 tablet 3   FLUoxetine (PROZAC) 40 MG capsule Take 1 capsule by mouth daily.     fluticasone (FLOVENT HFA) 110 MCG/ACT inhaler Inhale into the lungs.     ibuprofen (ADVIL) 400 MG tablet Take 1 tablet (400 mg total) by mouth every 8 (eight) hours as needed for up to 30 doses. 30 tablet 0   LORazepam (ATIVAN) 1 MG tablet Take 1 mg by mouth as needed.     pantoprazole (PROTONIX) 40 MG tablet Take 40 mg by mouth daily.     telmisartan (MICARDIS) 40 MG tablet Take 1 tablet (40 mg total) by mouth daily. 90 tablet 3   VYVANSE 70 MG capsule Take 1 capsule by mouth daily.  0   No current facility-administered medications for this visit.    LABS/IMAGING: No results found for this or any previous visit (from the past 48 hour(s)). No results found.  VITALS: BP 132/88   Pulse 75   Ht 5\' 5"  (1.651 m)   Wt 176 lb 9.6 oz (80.1 kg)   SpO2 98%   BMI 29.39 kg/m    EXAM: Deferred  EKG: N/A  ASSESSMENT: Uncontrolled hypertension History of palpitations Dyslipidemia goal LDL <100 CAC 0 - (2015) History of depression with significant life stressors Strong family history of coronary disease High LP(a) at 12  PLAN: 1.   Mrs.  Monjaras has had better blood pressure control on her current regimen.  She is less symptomatic with this.  She did have an increase in her creatinine.  We will go ahead and repeat her metabolic profile today.  I encouraged continued hydration.  We will also repeat her lipids which were due and I will get back to her on those results.  Plan follow-up with me in 6 months or sooner as necessary.  No medicine changes today.  Chrystie Nose, MD, [facility], FACP  [place]  [facility]  Medical Director of the [facility]  [facility] Reduction [facility] Diplomate of the American Board of Clinical Lipidology Attending Cardiologist  Direct Dial: [phone]  Fax: [phone]  Website:  [email or web address]   Chrystie Nose 04/09/2022, 8:13 AM

## 2022-04-10 ENCOUNTER — Encounter: Payer: Self-pay | Admitting: Internal Medicine

## 2022-06-17 ENCOUNTER — Encounter: Payer: Self-pay | Admitting: Emergency Medicine

## 2022-06-17 ENCOUNTER — Ambulatory Visit
Admission: EM | Admit: 2022-06-17 | Discharge: 2022-06-17 | Disposition: A | Discharge status: Home / Self Care | Payer: BC Managed Care – PPO | Attending: Urgent Care | Admitting: Urgent Care

## 2022-06-17 ENCOUNTER — Ambulatory Visit (INDEPENDENT_AMBULATORY_CARE_PROVIDER_SITE_OTHER): Payer: BC Managed Care – PPO

## 2022-06-17 DIAGNOSIS — M7731 Calcaneal spur, right foot: Secondary | ICD-10-CM

## 2022-06-17 DIAGNOSIS — M722 Plantar fascial fibromatosis: Secondary | ICD-10-CM

## 2022-06-17 DIAGNOSIS — M79671 Pain in right foot: Secondary | ICD-10-CM

## 2022-06-17 MED ORDER — NAPROXEN 375 MG PO TABS: 375.0000 mg | ORAL_TABLET | Freq: Two times a day (BID) | ORAL | 0 refills | Status: DC

## 2022-06-17 NOTE — ED Triage Notes (Signed)
Pt here with sharp, right heel pain x 2 days.

## 2022-06-17 NOTE — ED Provider Notes (Signed)
[facility]  Note:  This document was prepared using Conservation officer, historic buildings and may include unintentional dictation errors.  MRN: [id number] DOB: Jul 18, 1961  Subjective:   Shannon Gallagher is a 61 y.o. female presenting for 2-day history of acute onset moderate right heel pain on the underside of her heel.  Does not do a lot of walking.  Has a previous history of plantar fasciitis but is remote and cannot recall which what it was.  She does have an orthopedist at [facility].  No current facility-administered medications for this encounter.  Current Outpatient Medications:    atorvastatin (LIPITOR) 80 MG tablet, Take 1 tablet (80 mg total) by mouth daily., Disp: 90 tablet, Rfl: 3   buPROPion (WELLBUTRIN XL) 150 MG 24 hr tablet, Take 150 mg by mouth daily., Disp: , Rfl:    cetirizine (ZYRTEC ALLERGY) 10 MG tablet, Take 1 tablet (10 mg total) by mouth at bedtime., Disp: 30 tablet, Rfl: 2   diclofenac (VOLTAREN) 50 MG EC tablet, Take 50 mg by mouth 2 (two) times daily with a meal., Disp: , Rfl:    EUFLEXXA 20 MG/2ML SOSY, SMARTSIG:1 Syringe(s) I-ARTIC Once a Week, Disp: , Rfl:    ezetimibe (ZETIA) 10 MG tablet, Take 1 tablet (10 mg total) by mouth daily., Disp: 90 tablet, Rfl: 3   FLUoxetine (PROZAC) 40 MG capsule, Take 1 capsule by mouth daily., Disp: , Rfl:    fluticasone (FLOVENT HFA) 110 MCG/ACT inhaler, Inhale into the lungs., Disp: , Rfl:    ibuprofen (ADVIL) 400 MG tablet, Take 1 tablet (400 mg total) by mouth every 8 (eight) hours as needed for up to 30 doses., Disp: 30 tablet, Rfl: 0   LORazepam (ATIVAN) 1 MG tablet, Take 1 mg by mouth as needed., Disp: , Rfl:    pantoprazole (PROTONIX) 40 MG tablet, Take 40 mg by mouth daily., Disp: , Rfl:    telmisartan (MICARDIS) 40 MG tablet, Take 1 tablet (40 mg total) by mouth daily., Disp: 90 tablet, Rfl: 3   VYVANSE 70 MG capsule, Take 1 capsule by mouth daily., Disp: , Rfl: 0   No Known  Allergies  Past Medical History:  Diagnosis Date   Depression    Dyslipidemia    Positive KIF6 genotype.  Elevated LP(a)   Hyperhomocysteinemia (HCC)      Past Surgical History:  Procedure Laterality Date   2D Echo  05/30/2007   EF > 55%, Trace MR.    AUGMENTATION MAMMAPLASTY Bilateral 11/21/2020   Exercise stress test  07/17/2007   Work load 15 METS.  No CP or EKG changes.   REDUCTION MAMMAPLASTY      Family History  Problem Relation Age of Onset   Breast cancer Neg Hx     Social History   Tobacco Use   Smoking status: Never   Smokeless tobacco: Never  Substance Use Topics   Alcohol use: Yes    Comment: couple glasses wine/day   Drug use: No    ROS   Objective:   Vitals: BP (!) 145/98   Pulse 76   Temp 98.6 F (37 C)   Resp 18   SpO2 98%   Physical Exam Constitutional:      General: She is not in acute distress.    Appearance: Normal appearance. She is well-developed. She is not ill-appearing, toxic-appearing or diaphoretic.  HENT:     Head: Normocephalic and atraumatic.     Nose: Nose normal.  Mouth/Throat:     Mouth: Mucous membranes are moist.  Eyes:     General: No scleral icterus.       Right eye: No discharge.        Left eye: No discharge.     Extraocular Movements: Extraocular movements intact.  Cardiovascular:     Rate and Rhythm: Normal rate.  Pulmonary:     Effort: Pulmonary effort is normal.  Musculoskeletal:     Right ankle:     Right Achilles Tendon: No tenderness or defects. Thompson's test negative.     Right foot: Normal range of motion and normal capillary refill. Swelling (plantar surface of heel), tenderness and bony tenderness (worse with dorsiflexion of toes) present. No deformity, laceration or crepitus.  Skin:    General: Skin is warm and dry.  Neurological:     General: No focal deficit present.     Mental Status: She is alert and oriented to person, place, and time.  Psychiatric:        Mood and Affect: Mood  normal.        Behavior: Behavior normal.    DG Foot 2 Views Right  Result Date: 06/17/2022 CLINICAL DATA:  Right foot pain for 2 days. EXAM: RIGHT FOOT - 2 VIEW COMPARISON:  01/16/2018 FINDINGS: Soft tissues are unremarkable. No signs of acute fracture or dislocation. Mild hallux valgus deformity.Small plantar heel spur. IMPRESSION: 1. No acute findings. 2. Mild hallux valgus deformity. 3. Plantar heel spur. Electronically Signed   By: Signa Kell M.D.   On: 06/17/2022 11:42     Assessment and Plan :   PDMP not reviewed this encounter.  1. Plantar fasciitis of right foot   2. Pain of right heel   3. Heel spur, right    Recommended conservative management including naproxen for pain and inflammation.  Patient has an orthopedist that she will follow-up with as they previously helped her with plantar fasciitis.  She can otherwise use RICE method, wear heel splints at night. Counseled patient on potential for adverse effects with medications prescribed/recommended today, ER and return-to-clinic precautions discussed, patient verbalized understanding.    Wallis Bamberg, PA-C 06/17/22 1325

## 2022-06-27 ENCOUNTER — Encounter: Payer: Self-pay | Admitting: Podiatry

## 2022-06-27 ENCOUNTER — Ambulatory Visit (INDEPENDENT_AMBULATORY_CARE_PROVIDER_SITE_OTHER): Payer: BC Managed Care – PPO | Admitting: Podiatry

## 2022-06-27 ENCOUNTER — Ambulatory Visit (INDEPENDENT_AMBULATORY_CARE_PROVIDER_SITE_OTHER): Payer: BC Managed Care – PPO

## 2022-06-27 DIAGNOSIS — M722 Plantar fascial fibromatosis: Secondary | ICD-10-CM

## 2022-06-27 MED ORDER — TRIAMCINOLONE ACETONIDE 10 MG/ML IJ SUSP
10.0000 mg | Freq: Once | INTRAMUSCULAR | Status: AC
  Administered 2022-06-27: 10 mg

## 2022-06-27 NOTE — Patient Instructions (Signed)

## 2022-06-28 NOTE — Progress Notes (Signed)
Subjective:   Patient ID: Shannon Gallagher, female   DOB: 61 y.o.   MRN: [id number]   HPI Patient states she developed a lot of pain in her right heel of several weeks duration and states it is worse when she gets up in the morning after periods of sitting and feels like she has minimal arch support.  Patient does not smoke likes to be active   Review of Systems  All other systems reviewed and are negative.       Objective:  Physical Exam Vitals and nursing note reviewed.  Constitutional:      Appearance: She is well-developed.  Pulmonary:     Effort: Pulmonary effort is normal.  Musculoskeletal:        General: Normal range of motion.  Skin:    General: Skin is warm.  Neurological:     Mental Status: She is alert.     Neurovascular status intact muscle strength found to be adequate range of motion within normal limits.  Patient is found to have exquisite discomfort plantar aspect right heel at the insertional point of the tendon into the calcaneus with inflammation fluid around the medial band.  Patient is found to have good digital perfusion well oriented x3     Assessment:  Acute plantar fasciitis right with inflammation fluid of the medial band     Plan:  H&P reviewed condition recommended aggressive conservative treatment.  Sterile prep injected the medial band of the fascia 3 mg Kenalog 5 mg Xylocaine applied fascial brace to lift up the arch and instructed on the proper usage and wearing of it and it was fitted properly and placed on oral anti-inflammatory.  Discussed shoe gear modifications reappoint 2 weeks  X-rays indicate small spur no indication stress fracture arthritis

## 2022-07-11 ENCOUNTER — Ambulatory Visit (INDEPENDENT_AMBULATORY_CARE_PROVIDER_SITE_OTHER): Payer: BC Managed Care – PPO | Admitting: Podiatry

## 2022-07-11 ENCOUNTER — Encounter: Payer: Self-pay | Admitting: Podiatry

## 2022-07-11 DIAGNOSIS — M722 Plantar fascial fibromatosis: Secondary | ICD-10-CM | POA: Diagnosis not present

## 2022-07-12 NOTE — Progress Notes (Signed)
Subjective:   Patient ID: Shannon Gallagher, female   DOB: 61 y.o.   MRN: [id number]   HPI Patient states that she has improved quite significantly with mild discomfort still noted upon deep palpation   ROS      Objective:  Physical Exam  Neurovascular status intact diminishment of discomfort to palpation of the right plantar fascia with pain only upon deep pressure to the area     Assessment:  Plantar fascial symptomatology right improving with conservative care     Plan:  Reviewed the continuation of anti-inflammatories stretching exercises shoe gear modifications and heel lifts.  Hopefully this is the end of the issue will be seen back as symptoms indicate

## 2022-08-13 ENCOUNTER — Other Ambulatory Visit: Payer: Self-pay | Admitting: Internal Medicine

## 2022-08-16 ENCOUNTER — Encounter (HOSPITAL_BASED_OUTPATIENT_CLINIC_OR_DEPARTMENT_OTHER): Payer: Self-pay | Admitting: Internal Medicine

## 2022-08-16 DIAGNOSIS — E7849 Other hyperlipidemia: Secondary | ICD-10-CM

## 2022-09-04 ENCOUNTER — Ambulatory Visit (HOSPITAL_COMMUNITY)
Admission: RE | Admit: 2022-09-04 | Discharge: 2022-09-04 | Disposition: A | Discharge status: Home / Self Care | Payer: BC Managed Care – PPO | Source: Ambulatory Visit | Attending: Internal Medicine | Admitting: Internal Medicine

## 2022-09-04 DIAGNOSIS — E7849 Other hyperlipidemia: Secondary | ICD-10-CM | POA: Insufficient documentation

## 2022-09-14 DIAGNOSIS — E7841 Elevated Lipoprotein(a): Secondary | ICD-10-CM

## 2022-09-14 DIAGNOSIS — E7849 Other hyperlipidemia: Secondary | ICD-10-CM

## 2022-10-04 NOTE — Telephone Encounter (Signed)
Left message to call back  

## 2022-10-04 NOTE — Telephone Encounter (Signed)
Spoke with patient about med recommendations. She is not interested in adding another medication right now but would like to see where your labs are, as last test was >6 months ago and she has met deductible. Her last LDL was 83. Lipid panel ordered and she will repeat.   Explained to patient that PCSK9i can lower LPa (which was elevated in 2020) whereas oral therapies cannot. Explained that if she started PCKS9i, she would be on this med in addition to current therapies unless LDL was a negative value at which point changes may be made.   Routed to MD as Juluis Rainier

## 2022-10-12 LAB — LIPID PANEL
Chol/HDL Ratio: 3.9 ratio (ref 0.0–4.4)
Cholesterol, Total: 224 mg/dL — ABNORMAL HIGH (ref 100–199)
HDL: 58 mg/dL (ref >39)
LDL Chol Calc (NIH): 122 mg/dL — ABNORMAL HIGH (ref 0–99)
Triglycerides: 253 mg/dL — ABNORMAL HIGH (ref 0–149)
VLDL Cholesterol Cal: 44 mg/dL — ABNORMAL HIGH (ref 5–40)

## 2022-12-12 ENCOUNTER — Other Ambulatory Visit (HOSPITAL_COMMUNITY): Payer: Self-pay

## 2023-02-04 ENCOUNTER — Other Ambulatory Visit: Payer: Self-pay | Admitting: Obstetrics and Gynecology

## 2023-02-04 DIAGNOSIS — Z1231 Encounter for screening mammogram for malignant neoplasm of breast: Secondary | ICD-10-CM

## 2023-02-06 ENCOUNTER — Other Ambulatory Visit: Payer: Self-pay | Admitting: Internal Medicine

## 2023-02-12 ENCOUNTER — Ambulatory Visit
Admission: RE | Admit: 2023-02-12 | Discharge: 2023-02-12 | Disposition: A | Discharge status: Home / Self Care | Payer: BC Managed Care – PPO | Source: Ambulatory Visit | Attending: Obstetrics and Gynecology | Admitting: Obstetrics and Gynecology

## 2023-02-12 DIAGNOSIS — Z1231 Encounter for screening mammogram for malignant neoplasm of breast: Secondary | ICD-10-CM

## 2023-03-05 ENCOUNTER — Other Ambulatory Visit: Payer: Self-pay

## 2023-03-05 ENCOUNTER — Encounter (HOSPITAL_COMMUNITY): Payer: Self-pay

## 2023-03-05 ENCOUNTER — Emergency Department (HOSPITAL_COMMUNITY): Payer: BC Managed Care – PPO

## 2023-03-05 ENCOUNTER — Emergency Department (HOSPITAL_COMMUNITY)
Admission: EM | Admit: 2023-03-05 | Discharge: 2023-03-05 | Disposition: A | Discharge status: Home / Self Care | Payer: BC Managed Care – PPO | Attending: Emergency Medicine | Admitting: Emergency Medicine

## 2023-03-05 ENCOUNTER — Ambulatory Visit (HOSPITAL_COMMUNITY): Admit: 2023-03-05 | Payer: BC Managed Care – PPO

## 2023-03-05 ENCOUNTER — Other Ambulatory Visit (HOSPITAL_COMMUNITY): Payer: Self-pay

## 2023-03-05 DIAGNOSIS — Z79899 Other long term (current) drug therapy: Secondary | ICD-10-CM | POA: Insufficient documentation

## 2023-03-05 DIAGNOSIS — M5412 Radiculopathy, cervical region: Secondary | ICD-10-CM | POA: Diagnosis not present

## 2023-03-05 DIAGNOSIS — I10 Essential (primary) hypertension: Secondary | ICD-10-CM | POA: Diagnosis not present

## 2023-03-05 DIAGNOSIS — M546 Pain in thoracic spine: Secondary | ICD-10-CM | POA: Diagnosis not present

## 2023-03-05 DIAGNOSIS — M4802 Spinal stenosis, cervical region: Secondary | ICD-10-CM | POA: Diagnosis not present

## 2023-03-05 DIAGNOSIS — M542 Cervicalgia: Secondary | ICD-10-CM | POA: Diagnosis present

## 2023-03-05 HISTORY — DX: Essential (primary) hypertension: I10

## 2023-03-05 HISTORY — DX: Gastro-esophageal reflux disease without esophagitis: K21.9

## 2023-03-05 LAB — CBC
HCT: 41.2 % (ref 36.0–46.0)
Hemoglobin: 13.4 g/dL (ref 12.0–15.0)
MCH: 30.5 pg (ref 26.0–34.0)
MCHC: 32.5 g/dL (ref 30.0–36.0)
MCV: 93.6 fL (ref 80.0–100.0)
Platelets: 309 10*3/uL (ref 150–400)
RBC: 4.4 MIL/uL (ref 3.87–5.11)
RDW: 12.6 % (ref 11.5–15.5)
WBC: 6.3 10*3/uL (ref 4.0–10.5)
nRBC: 0 % (ref 0.0–0.2)

## 2023-03-05 LAB — BASIC METABOLIC PANEL
Anion gap: 13 (ref 5–15)
BUN: 23 mg/dL — ABNORMAL HIGH (ref 6–20)
CO2: 21 mmol/L — ABNORMAL LOW (ref 22–32)
Calcium: 9.3 mg/dL (ref 8.9–10.3)
Chloride: 101 mmol/L (ref 98–111)
Creatinine, Ser: 0.9 mg/dL (ref 0.44–1.00)
GFR, Estimated: >60 mL/min (ref >60)
Glucose, Bld: 106 mg/dL — ABNORMAL HIGH (ref 70–99)
Potassium: 4.3 mmol/L (ref 3.5–5.1)
Sodium: 135 mmol/L (ref 135–145)

## 2023-03-05 LAB — TROPONIN I (HIGH SENSITIVITY): Troponin I (High Sensitivity): 4 ng/L (ref <18)

## 2023-03-05 MED ORDER — KETOROLAC TROMETHAMINE 15 MG/ML IJ SOLN
15.0000 mg | Freq: Once | INTRAMUSCULAR | Status: AC
  Administered 2023-03-05: 15 mg via INTRAMUSCULAR
  Filled 2023-03-05: qty 1

## 2023-03-05 MED ORDER — CYCLOBENZAPRINE HCL 10 MG PO TABS
10.0000 mg | ORAL_TABLET | Freq: Three times a day (TID) | ORAL | 0 refills | Status: AC
  Filled 2023-03-05: qty 15, 5d supply, fill #0

## 2023-03-05 MED ORDER — LIDOCAINE 5 % EX PTCH: 1.0000 | MEDICATED_PATCH | CUTANEOUS | 0 refills | Status: AC

## 2023-03-05 MED ORDER — LIDOCAINE 5 % EX PTCH
1.0000 | MEDICATED_PATCH | Freq: Every day | CUTANEOUS | 0 refills | Status: AC
  Filled 2023-03-05: qty 5, 5d supply, fill #0

## 2023-03-05 MED ORDER — CYCLOBENZAPRINE HCL 10 MG PO TABS
10.0000 mg | ORAL_TABLET | Freq: Once | ORAL | Status: AC
  Administered 2023-03-05: 10 mg via ORAL
  Filled 2023-03-05: qty 1

## 2023-03-05 MED ORDER — IBUPROFEN 600 MG PO TABS: 600.0000 mg | ORAL_TABLET | Freq: Three times a day (TID) | ORAL | 0 refills | Status: AC | PRN

## 2023-03-05 MED ORDER — CYCLOBENZAPRINE HCL 10 MG PO TABS: 10.0000 mg | ORAL_TABLET | Freq: Three times a day (TID) | ORAL | 0 refills | Status: AC | PRN

## 2023-03-05 MED ORDER — OXYCODONE-ACETAMINOPHEN 5-325 MG PO TABS
2.0000 | ORAL_TABLET | Freq: Once | ORAL | Status: AC
  Administered 2023-03-05: 2 via ORAL
  Filled 2023-03-05: qty 2

## 2023-03-05 MED ORDER — IBUPROFEN 600 MG PO TABS
600.0000 mg | ORAL_TABLET | Freq: Three times a day (TID) | ORAL | 0 refills | Status: AC
  Filled 2023-03-05: qty 15, 5d supply, fill #0

## 2023-03-05 NOTE — ED Provider Notes (Signed)
[facility] Provider Note   CSN: [id number] Arrival date & time: 03/05/23  2952     History  Chief Complaint  Patient presents with   Back Pain    Shannon Gallagher is a 62 y.o. female with past medical history hypertension, hyperlipidemia, anxiety, depression who presents to the ED complaining of left upper back and neck pain that started 4 days ago as well as paresthesias to the left arm that started early this morning around 1am.  She denies fall or injury to the neck, back, or arm.  No history of the symptoms.  No history of neck problems.  She denies associated chest pain, shortness of breath, abdominal pain, nausea, vomiting, diarrhea, fever, chills, or focal weakness.  She states that she was googling her symptoms and became concerned for a pinched nerve or heart attack so she presented to the ED for further evaluation.  No personal history of CAD.  Family history of CAD at advanced age in grandparent.  She has tried a massage gun, heating pad, and Voltaren gel at home without relief of symptoms.  Pain is worse with deep breathing and movement of the neck and trunk.      Home Medications Prior to Admission medications   Medication Sig Start Date End Date Taking? Authorizing Provider  cyclobenzaprine (FLEXERIL) 10 MG tablet Take 1 tablet (10 mg total) by mouth 3 (three) times daily as needed for up to 5 days for muscle spasms. 03/05/23 03/10/23 Yes Cortina Vultaggio L, PA-C  ibuprofen (ADVIL) 600 MG tablet Take 1 tablet (600 mg total) by mouth every 8 (eight) hours as needed for up to 5 days for mild pain or moderate pain. 03/05/23 03/10/23 Yes Deni Lefever L, PA-C  lidocaine (LIDODERM) 5 % Place 1 patch onto the skin daily for 5 days. Remove & Discard patch within 12 hours or as directed by MD 03/05/23 03/10/23 Yes Kamonte Mcmichen L, PA-C  atorvastatin (LIPITOR) 80 MG tablet TAKE 1 TABLET DAILY (NEEDS TO SCHEDULE AN APPOINTMENT WITH CARDIOLOGY  BEFORE RECEIVING FUTURE REFILLS) 08/14/22   Hilty, Lisette Abu, MD  buPROPion (WELLBUTRIN XL) 150 MG 24 hr tablet Take 150 mg by mouth daily.    [provider]  cetirizine (ZYRTEC ALLERGY) 10 MG tablet Take 1 tablet (10 mg total) by mouth at bedtime. 12/25/21 04/09/22  Theadora Rama Scales, PA-C  cyclobenzaprine (FLEXERIL) 10 MG tablet Take 1 tablet (10 mg total) by mouth 3 (three) times daily for 5 days for muscle spasms. 03/05/23 03/10/23  Gloris Manchester, MD  diclofenac (VOLTAREN) 50 MG EC tablet Take 50 mg by mouth 2 (two) times daily with a meal. 03/16/21   [provider]  EUFLEXXA 20 MG/2ML SOSY SMARTSIG:1 Syringe(s) I-ARTIC Once a Week 09/22/21   [provider]  ezetimibe (ZETIA) 10 MG tablet TAKE 1 TABLET(10 MG) BY MOUTH DAILY 02/06/23   Hilty, Lisette Abu, MD  FLUoxetine (PROZAC) 40 MG capsule Take 1 capsule by mouth daily.    [provider]  fluticasone (FLOVENT HFA) 110 MCG/ACT inhaler Inhale into the lungs. 05/04/21   [provider]  ibuprofen (ADVIL) 600 MG tablet Take 1 tablet (600 mg total) by mouth every 8 (eight) hours as needed for mild or moderate pain. 03/05/23 03/10/23  Gloris Manchester, MD  lidocaine (LIDODERM) 5 % Place 1 patch onto the skin daily for 5 days. Remove and Discard patch within 12 hours or as directed by MD. 03/05/23 03/10/23  Durwin Nora,  Alycia Rossetti, MD  LORazepam (ATIVAN) 1 MG tablet Take 1 mg by mouth as needed.    [provider]  naproxen (NAPROSYN) 375 MG tablet Take 1 tablet (375 mg total) by mouth 2 (two) times daily with a meal. 06/17/22   Wallis Bamberg, PA-C  pantoprazole (PROTONIX) 40 MG tablet Take 40 mg by mouth daily. 09/14/21   [provider]  telmisartan (MICARDIS) 40 MG tablet Take 1 tablet (40 mg total) by mouth daily. 01/05/22   Hilty, Lisette Abu, MD  VYVANSE 70 MG capsule Take 1 capsule by mouth daily. 12/04/16   [provider]      Allergies    Patient has no known allergies.    Review of Systems    Review of Systems  All other systems reviewed and are negative.   Physical Exam Updated Vital Signs BP (!) 165/111 (BP Location: Right Arm)   Pulse 93   Temp 99.1 F (37.3 C)   Resp 16   Ht 5\' 5"  (1.651 m)   Wt 80.1 kg   SpO2 97%   BMI 29.39 kg/m  Physical Exam Vitals and nursing note reviewed.  Constitutional:      General: She is not in acute distress.    Appearance: Normal appearance. She is not ill-appearing, toxic-appearing or diaphoretic.  HENT:     Head: Normocephalic and atraumatic.     Mouth/Throat:     Mouth: Mucous membranes are moist.  Eyes:     General: No scleral icterus.    Conjunctiva/sclera: Conjunctivae normal.  Neck:     Comments: No midline cervical spinal tenderness, step-offs, or deformities, mild tenderness over left trapezius musculature Cardiovascular:     Rate and Rhythm: Normal rate and regular rhythm.     Heart sounds: No murmur heard. Pulmonary:     Effort: Pulmonary effort is normal. No respiratory distress.     Breath sounds: Normal breath sounds. No stridor. No wheezing, rhonchi or rales.  Abdominal:     General: Abdomen is flat. There is no distension.     Palpations: Abdomen is soft.     Tenderness: There is no abdominal tenderness. There is no guarding or rebound.  Musculoskeletal:        General: No deformity. Normal range of motion.     Cervical back: Normal range of motion and neck supple. No rigidity.     Right lower leg: No edema.     Left lower leg: No edema.     Comments: No midline TL spinal tenderness, stepoffs, or deformities, moving all extremities equally and x 4, normal gait  Skin:    General: Skin is warm and dry.     Capillary Refill: Capillary refill takes less than 2 seconds.     Coloration: Skin is not jaundiced or pale.     Findings: No rash.  Neurological:     Mental Status: She is alert and oriented to person, place, and time.     GCS: GCS eye subscore is 4. GCS verbal subscore is 5. GCS motor subscore is  6.     Cranial Nerves: Cranial nerves 2-12 are intact. No cranial nerve deficit, dysarthria or facial asymmetry.     Sensory: Sensation is intact.     Motor: Motor function is intact. No weakness, tremor, atrophy, abnormal muscle tone or seizure activity.     Coordination: Coordination is intact.     Gait: Gait is intact.  Psychiatric:        Mood and Affect:  Mood is anxious.        Speech: Speech normal.        Behavior: Behavior normal. Behavior is cooperative.        Thought Content: Thought content normal.     ED Results / Procedures / Treatments   Labs (all labs ordered are listed, but only abnormal results are displayed) Labs Reviewed  BASIC METABOLIC PANEL - Abnormal; Notable for the following components:      Result Value   CO2 21 (*)    Glucose, Bld 106 (*)    BUN 23 (*)    All other components within normal limits  CBC  TROPONIN I (HIGH SENSITIVITY)    EKG EKG Interpretation  Date/Time:  Tuesday Mar 05 2023 08:53:11 EDT Ventricular Rate:  88 PR Interval:  154 QRS Duration: 78 QT Interval:  346 QTC Calculation: 418 R Axis:   32 Text Interpretation: Normal sinus rhythm Possible Left atrial enlargement Nonspecific ST abnormality Abnormal ECG Confirmed by Gloris Manchester ([id number]) on 03/05/2023 10:16:46 AM  Radiology CT Head Wo Contrast  Result Date: 03/05/2023 CLINICAL DATA:  Neuro deficit, acute, stroke suspected; Cervical radiculopathy, no red flags EXAM: CT HEAD WITHOUT CONTRAST CT CERVICAL SPINE WITHOUT CONTRAST TECHNIQUE: Multidetector CT imaging of the head and cervical spine was performed following the standard protocol without intravenous contrast. Multiplanar CT image reconstructions of the cervical spine were also generated. RADIATION DOSE REDUCTION: This exam was performed according to the departmental dose-optimization program which includes automated exposure control, adjustment of the mA and/or kV according to patient size and/or use of iterative reconstruction  technique. COMPARISON:  None Available. FINDINGS: CT HEAD FINDINGS Brain: No evidence of acute infarction, hemorrhage, hydrocephalus, extra-axial collection or mass lesion/mass effect. Vascular: No hyperdense vessel or unexpected calcification. Skull: Normal. Negative for fracture or focal lesion. Sinuses/Orbits: No middle ear or mastoid effusion. Mild mucosal thickening of the floor of bilateral maxillary sinuses. Orbits are unremarkable. Other: None. CT CERVICAL SPINE FINDINGS Alignment: Straightening of the normal cervical lordosis. Grade 1 anterolisthesis of C4 on C5 and C7 on T1. Skull base and vertebrae: No acute fracture. No primary bone lesion or focal pathologic process. Soft tissues and spinal canal: No prevertebral fluid or swelling. No visible canal hematoma. Disc levels: No evidence of high-grade spinal canal stenosis. There is a least moderate left-sided neural foraminal stenosis at C4-C5, C5-C6. There is severe left-sided neural foraminal stenosis at C6-C7. Upper chest: Negative. Other: None IMPRESSION: 1. No acute intracranial abnormality. 2. No acute fracture or traumatic subluxation of the cervical spine. 3. Severe left-sided neural foraminal stenosis at C6-C7. Electronically Signed   By: Lorenza Cambridge M.D.   On: 03/05/2023 10:41   CT Cervical Spine Wo Contrast  Result Date: 03/05/2023 CLINICAL DATA:  Neuro deficit, acute, stroke suspected; Cervical radiculopathy, no red flags EXAM: CT HEAD WITHOUT CONTRAST CT CERVICAL SPINE WITHOUT CONTRAST TECHNIQUE: Multidetector CT imaging of the head and cervical spine was performed following the standard protocol without intravenous contrast. Multiplanar CT image reconstructions of the cervical spine were also generated. RADIATION DOSE REDUCTION: This exam was performed according to the departmental dose-optimization program which includes automated exposure control, adjustment of the mA and/or kV according to patient size and/or use of iterative  reconstruction technique. COMPARISON:  None Available. FINDINGS: CT HEAD FINDINGS Brain: No evidence of acute infarction, hemorrhage, hydrocephalus, extra-axial collection or mass lesion/mass effect. Vascular: No hyperdense vessel or unexpected calcification. Skull: Normal. Negative for fracture or focal lesion. Sinuses/Orbits: No middle ear or  mastoid effusion. Mild mucosal thickening of the floor of bilateral maxillary sinuses. Orbits are unremarkable. Other: None. CT CERVICAL SPINE FINDINGS Alignment: Straightening of the normal cervical lordosis. Grade 1 anterolisthesis of C4 on C5 and C7 on T1. Skull base and vertebrae: No acute fracture. No primary bone lesion or focal pathologic process. Soft tissues and spinal canal: No prevertebral fluid or swelling. No visible canal hematoma. Disc levels: No evidence of high-grade spinal canal stenosis. There is a least moderate left-sided neural foraminal stenosis at C4-C5, C5-C6. There is severe left-sided neural foraminal stenosis at C6-C7. Upper chest: Negative. Other: None IMPRESSION: 1. No acute intracranial abnormality. 2. No acute fracture or traumatic subluxation of the cervical spine. 3. Severe left-sided neural foraminal stenosis at C6-C7. Electronically Signed   By: Lorenza Cambridge M.D.   On: 03/05/2023 10:41   DG Chest 2 View  Result Date: 03/05/2023 CLINICAL DATA:  numbness in arm EXAM: CHEST - 2 VIEW COMPARISON:  None Available. FINDINGS: The heart size and mediastinal contours are within normal limits. Both lungs are clear. The visualized skeletal structures are unremarkable. IMPRESSION: No active cardiopulmonary disease. Electronically Signed   By: Lorenza Cambridge M.D.   On: 03/05/2023 10:11    Procedures Procedures    Medications Ordered in ED Medications  cyclobenzaprine (FLEXERIL) tablet 10 mg (10 mg Oral Given 03/05/23 1035)  ketorolac (TORADOL) 15 MG/ML injection 15 mg (15 mg Intramuscular Given 03/05/23 1035)  oxyCODONE-acetaminophen  (PERCOCET/ROXICET) 5-325 MG per tablet 2 tablet (2 tablets Oral Given 03/05/23 1206)    ED Course/ Medical Decision Making/ A&P                             Medical Decision Making Amount and/or Complexity of Data Reviewed Labs: ordered. Decision-making details documented in ED Course. Radiology: ordered. Decision-making details documented in ED Course. ECG/medicine tests: ordered. Decision-making details documented in ED Course.  Risk Prescription drug management.   Medical Decision Making:   SHYREE PIECZYNSKI is a 62 y.o. female who presented to the ED today with neck pain detailed above.    Patient's presentation is complicated by their history of HTN, HLD.  Complete initial physical exam performed, notably the patient  was in NAD. No midline spinal tenderness, stepoffs, or deformities. Nonfocal neuro exam. Slightly anxious. RRR, LCTA. No meningismus. Nontoxic appearing.   Trapezius tenderness on left.  Reviewed and confirmed nursing documentation for past medical history, family history, social history.    Initial Assessment:   With the patient's presentation, differential diagnosis includes but is not limited to: Acute coronary syndrome, pericarditis, aortic dissection, pulmonary embolism, tension pneumothorax, esophageal rupture, angina, aortic stenosis, cardiomyopathy, myocarditis, mitral valve prolapse, pulmonary hypertension, hypertrophic obstructive cardiomyopathy (HOCM), aortic insufficiency, right ventricular hypertrophy, pneumonia, pleuritis, bronchitis, pneumothorax, tumor, gastroesophageal reflux disease (GERD), esophageal spasm, Mallory-Weiss syndrome, peptic ulcer disease, biliary disease, pancreatitis, functional gastrointestinal pain, cervical or thoracic disk disease or arthritis, shoulder arthritis, costochondritis, subacromial bursitis, anxiety or panic attack, herpes zoster, breast disorders, chest wall tumors, thoracic outlet syndrome, mediastinitis, degenerative disc  disease, disk herniation, cervical radiculopathy.    Initial Plan:  Screening labs including CBC and Metabolic panel to evaluate for infectious or metabolic etiology of disease.  CXR to evaluate for structural/infectious intrathoracic pathology.  EKG and troponin to evaluate for cardiac pathology CT brain, cervical spine to assess for intracranial/cervical etiology of symptoms Symptomatic management Objective evaluation as reviewed   Initial Study Results:   Laboratory  All laboratory results reviewed without evidence of clinically relevant pathology.   Exceptions include: BUN 23, CO2 21  EKG EKG was reviewed independently. ST segments without concerns for elevations.   EKG: normal sinus rhythm. Minimal depressions in inferior leads.   Radiology:  All images reviewed independently. Agree with radiology report at this time.   CT Head Wo Contrast  Result Date: 03/05/2023 CLINICAL DATA:  Neuro deficit, acute, stroke suspected; Cervical radiculopathy, no red flags EXAM: CT HEAD WITHOUT CONTRAST CT CERVICAL SPINE WITHOUT CONTRAST TECHNIQUE: Multidetector CT imaging of the head and cervical spine was performed following the standard protocol without intravenous contrast. Multiplanar CT image reconstructions of the cervical spine were also generated. RADIATION DOSE REDUCTION: This exam was performed according to the departmental dose-optimization program which includes automated exposure control, adjustment of the mA and/or kV according to patient size and/or use of iterative reconstruction technique. COMPARISON:  None Available. FINDINGS: CT HEAD FINDINGS Brain: No evidence of acute infarction, hemorrhage, hydrocephalus, extra-axial collection or mass lesion/mass effect. Vascular: No hyperdense vessel or unexpected calcification. Skull: Normal. Negative for fracture or focal lesion. Sinuses/Orbits: No middle ear or mastoid effusion. Mild mucosal thickening of the floor of bilateral maxillary  sinuses. Orbits are unremarkable. Other: None. CT CERVICAL SPINE FINDINGS Alignment: Straightening of the normal cervical lordosis. Grade 1 anterolisthesis of C4 on C5 and C7 on T1. Skull base and vertebrae: No acute fracture. No primary bone lesion or focal pathologic process. Soft tissues and spinal canal: No prevertebral fluid or swelling. No visible canal hematoma. Disc levels: No evidence of high-grade spinal canal stenosis. There is a least moderate left-sided neural foraminal stenosis at C4-C5, C5-C6. There is severe left-sided neural foraminal stenosis at C6-C7. Upper chest: Negative. Other: None IMPRESSION: 1. No acute intracranial abnormality. 2. No acute fracture or traumatic subluxation of the cervical spine. 3. Severe left-sided neural foraminal stenosis at C6-C7. Electronically Signed   By: Lorenza Cambridge M.D.   On: 03/05/2023 10:41   CT Cervical Spine Wo Contrast  Result Date: 03/05/2023 CLINICAL DATA:  Neuro deficit, acute, stroke suspected; Cervical radiculopathy, no red flags EXAM: CT HEAD WITHOUT CONTRAST CT CERVICAL SPINE WITHOUT CONTRAST TECHNIQUE: Multidetector CT imaging of the head and cervical spine was performed following the standard protocol without intravenous contrast. Multiplanar CT image reconstructions of the cervical spine were also generated. RADIATION DOSE REDUCTION: This exam was performed according to the departmental dose-optimization program which includes automated exposure control, adjustment of the mA and/or kV according to patient size and/or use of iterative reconstruction technique. COMPARISON:  None Available. FINDINGS: CT HEAD FINDINGS Brain: No evidence of acute infarction, hemorrhage, hydrocephalus, extra-axial collection or mass lesion/mass effect. Vascular: No hyperdense vessel or unexpected calcification. Skull: Normal. Negative for fracture or focal lesion. Sinuses/Orbits: No middle ear or mastoid effusion. Mild mucosal thickening of the floor of bilateral  maxillary sinuses. Orbits are unremarkable. Other: None. CT CERVICAL SPINE FINDINGS Alignment: Straightening of the normal cervical lordosis. Grade 1 anterolisthesis of C4 on C5 and C7 on T1. Skull base and vertebrae: No acute fracture. No primary bone lesion or focal pathologic process. Soft tissues and spinal canal: No prevertebral fluid or swelling. No visible canal hematoma. Disc levels: No evidence of high-grade spinal canal stenosis. There is a least moderate left-sided neural foraminal stenosis at C4-C5, C5-C6. There is severe left-sided neural foraminal stenosis at C6-C7. Upper chest: Negative. Other: None IMPRESSION: 1. No acute intracranial abnormality. 2. No acute fracture or traumatic subluxation of the cervical spine. 3.  Severe left-sided neural foraminal stenosis at C6-C7. Electronically Signed   By: Lorenza Cambridge M.D.   On: 03/05/2023 10:41   DG Chest 2 View  Result Date: 03/05/2023 CLINICAL DATA:  numbness in arm EXAM: CHEST - 2 VIEW COMPARISON:  None Available. FINDINGS: The heart size and mediastinal contours are within normal limits. Both lungs are clear. The visualized skeletal structures are unremarkable. IMPRESSION: No active cardiopulmonary disease. Electronically Signed   By: Lorenza Cambridge M.D.   On: 03/05/2023 10:11    Final Assessment and Plan:   62 year old female presents to ED for evaluation of left sided neck pain, left arm paresthesias. No injury. Neurologically intact on exam. No chest pain, SOB. Presented with concern for ACS. Workup initiated as above for further assessment. Code stroke not activated with LKWT not within 4 hours. Low suspicion for TIA/CVA, intracranial pathology. CT head negative. CT cervical spine with severe spinal stenosis at C6-C7. EKG with minimal inferior depressions. No elevations. Again, pt without chest pain, shortness of breath, story inconsistent with ACS. Troponin normal. Remainder of blood work unremarkable. Discussed findings with pt. Suspect  symptoms secondary to cervical radiculopathy. Will treat symptomatically and have follow up with NSG/ortho for further management. Strict ED return precautions given, all questions answered, and pt stable for discharge.    Clinical Impression:  1. Cervical radiculopathy   2. Spinal stenosis of cervical region      Discharge           Final Clinical Impression(s) / ED Diagnoses Final diagnoses:  Spinal stenosis of cervical region  Cervical radiculopathy    Rx / DC Orders ED Discharge Orders          Ordered    cyclobenzaprine (FLEXERIL) 10 MG tablet  3 times daily PRN        03/05/23 1159    ibuprofen (ADVIL) 600 MG tablet  Every 8 hours PRN        03/05/23 1159    lidocaine (LIDODERM) 5 %  Every 24 hours        03/05/23 1159              Tonette Lederer, PA-C 03/05/23 1816    Gloris Manchester, MD 03/10/23 1705

## 2023-03-05 NOTE — ED Triage Notes (Signed)
Pt c/o left sided upper back painx3d. Pt c/o tingling of left arm started at 0200 today. LKW today at 67

## 2023-03-05 NOTE — ED Notes (Signed)
Pt is a&ox4, pwd. Pt is complaining of a stabbing type pain between her shoulder blades and at base of neck. PMS intact. Pt denies any numbness or tingling. Side rails up x 2, pt close to RN station should she need anything. Covered with blanket

## 2023-03-05 NOTE — Discharge Instructions (Signed)
Thank you for letting us take care of you today.  Your heart enzymes, chest x-ray, and blood work today looks good.  The CT scan of your head was also normal.  We do see some changes on the CT scan of your neck which appear to be related to spinal stenosis.  I am prescribing multiple medications to help with this at home.  You do appear to have somewhat advanced disease, I recommend following up with specialist such as neurosurgery or orthopedics.  I provided both of the specialist above.  Please call and schedule an appointment as soon as possible.  If unable to get in with the specialist soon, please follow-up closely with your PCP.  For any new or worsening symptoms including weakness in your arms or legs, worsening pain or injury, chest pain, shortness of breath, severe headache, fever, or other new, concerning symptoms, please return to the nearest ED for reevaluation.

## 2023-03-07 ENCOUNTER — Other Ambulatory Visit: Payer: Self-pay | Admitting: Internal Medicine

## 2023-03-07 DIAGNOSIS — I10 Essential (primary) hypertension: Secondary | ICD-10-CM

## 2023-03-19 ENCOUNTER — Ambulatory Visit (HOSPITAL_BASED_OUTPATIENT_CLINIC_OR_DEPARTMENT_OTHER): Payer: BC Managed Care – PPO | Admitting: Internal Medicine

## 2023-04-16 ENCOUNTER — Other Ambulatory Visit: Payer: Self-pay

## 2023-04-16 DIAGNOSIS — R202 Paresthesia of skin: Secondary | ICD-10-CM

## 2023-04-19 ENCOUNTER — Ambulatory Visit (INDEPENDENT_AMBULATORY_CARE_PROVIDER_SITE_OTHER): Payer: BC Managed Care – PPO | Admitting: Neurology

## 2023-04-19 DIAGNOSIS — G5622 Lesion of ulnar nerve, left upper limb: Secondary | ICD-10-CM

## 2023-04-19 DIAGNOSIS — R202 Paresthesia of skin: Secondary | ICD-10-CM

## 2023-04-19 DIAGNOSIS — M5412 Radiculopathy, cervical region: Secondary | ICD-10-CM

## 2023-04-19 NOTE — Procedures (Signed)
  [facility]  [address] [place], [address]  [place], [place] [address] Tel: [phone] Fax: [phone] Test Date:  04/19/2023  Patient: Shannon Gallagher DOB: Oct 07, 1961 Physician: Nita Sickle, DO  Sex: Female Height: 5\' 5"  Ref Phys: Lisbeth Renshaw, MD  ID#: [id number]   Technician:    History: This is a 62 year old female referred for evaluation of left hand paresthesias.  NCV & EMG Findings: Extensive electrodiagnostic testing of the left upper extremity shows:  Left median, ulnar, and mixed palmer sensory response are within normal limits. Left median motor response is within normal limits.  Left ulnar motor response shows decreased conduction velocity (A Elbow-B Elbow, 45 m/s). Chronic motor axon loss changes are seen affecting the pronator teres and triceps muscles on the left.    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 Left ulnar neuropathy with slowing across the elbow, demyelinating, mild.    ___________________________ Nita Sickle, DO    Nerve Conduction Studies   Stim Site NR Peak (ms) Norm Peak (ms) O-P Amp (V) Norm O-P Amp  Left Median Anti Sensory (2nd Digit)  32 C  Wrist    3.2 <3.6 31.1 >15  Left Ulnar Anti Sensory (5th Digit)  32 C  Wrist    2.4 <3.1 25.2 >10     Stim Site NR Onset (ms) Norm Onset (ms) O-P Amp (mV) Norm O-P Amp Site1 Site2 Delta-0 (ms) Dist (cm) Vel (m/s) Norm Vel (m/s)  Left Median Motor (Abd Poll Brev)  32 C  Wrist    2.8 <4.0 8.1 >6 Elbow Wrist 5.1 30.0 59 >50  Elbow    7.9  7.8         Left Ulnar Motor (Abd Dig Minimi)  32 C  Wrist    2.3 <3.1 8.1 >7 B Elbow Wrist 3.3 21.0 64 >50  B Elbow    5.6  7.7  A Elbow B Elbow 2.2 10.0 *45 >50  A Elbow    7.8  6.2            Stim Site NR Peak (ms) Norm Peak (ms) P-T Amp (V) Site1 Site2 Delta-P (ms) Norm Delta (ms)  Left Median/Ulnar Palm Comparison (Wrist - 8cm)  32 C  Median Palm    1.7 <2.2 104.4 Median Palm Ulnar Palm 0.2    Ulnar Palm    1.5 <2.2 15.4       Electromyography   Side Muscle Ins.Act Fibs Fasc Recrt Amp Dur Poly Activation Comment  Left 1stDorInt Nml Nml Nml Nml Nml Nml Nml Nml N/A  Left Abd Poll Brev Nml Nml Nml Nml Nml Nml Nml Nml N/A  Left PronatorTeres Nml Nml Nml *1- *1+ *1+ *1+ Nml N/A  Left Biceps Nml Nml Nml Nml Nml Nml Nml Nml N/A  Left Triceps Nml Nml Nml *1- *1+ *1+ *1+ Nml N/A  Left Deltoid Nml Nml Nml Nml Nml Nml Nml Nml N/A  Left Abd Dig Min Nml Nml Nml Nml Nml Nml Nml Nml N/A  Left FlexCarpiUln Nml Nml Nml Nml Nml Nml Nml Nml N/A      Waveforms:

## 2023-05-27 ENCOUNTER — Other Ambulatory Visit: Payer: Self-pay

## 2023-05-28 ENCOUNTER — Other Ambulatory Visit: Payer: Self-pay

## 2023-05-28 MED ORDER — SEMAGLUTIDE-WEIGHT MANAGEMENT 0.5 MG/0.5ML ~~LOC~~ SOAJ
0.5000 mg | SUBCUTANEOUS | 0 refills | Status: DC
  Filled 2023-05-28: qty 2, 28d supply, fill #0

## 2023-06-04 ENCOUNTER — Other Ambulatory Visit: Payer: Self-pay | Admitting: Internal Medicine

## 2023-06-06 ENCOUNTER — Other Ambulatory Visit: Payer: Self-pay

## 2023-06-06 MED ORDER — FLUTICASONE FUROATE-VILANTEROL 100-25 MCG/ACT IN AEPB
1.0000 | INHALATION_SPRAY | Freq: Every day | RESPIRATORY_TRACT | 12 refills | Status: DC
  Filled 2023-06-06: qty 60, 30d supply, fill #0

## 2023-06-07 ENCOUNTER — Other Ambulatory Visit: Payer: Self-pay

## 2023-06-13 ENCOUNTER — Other Ambulatory Visit: Payer: Self-pay

## 2023-06-14 ENCOUNTER — Other Ambulatory Visit: Payer: Self-pay

## 2023-06-18 ENCOUNTER — Other Ambulatory Visit: Payer: Self-pay

## 2023-06-20 ENCOUNTER — Other Ambulatory Visit: Payer: Self-pay

## 2023-06-21 ENCOUNTER — Other Ambulatory Visit: Payer: Self-pay

## 2023-06-24 ENCOUNTER — Other Ambulatory Visit: Payer: Self-pay

## 2023-06-25 ENCOUNTER — Other Ambulatory Visit: Payer: Self-pay

## 2023-06-25 ENCOUNTER — Other Ambulatory Visit (HOSPITAL_COMMUNITY): Payer: Self-pay

## 2023-06-25 MED ORDER — WEGOVY 1 MG/0.5ML ~~LOC~~ SOAJ
1.0000 mg | SUBCUTANEOUS | 0 refills | Status: DC
  Filled 2023-06-25: qty 2, 28d supply, fill #0

## 2023-06-26 ENCOUNTER — Other Ambulatory Visit: Payer: Self-pay

## 2023-06-27 ENCOUNTER — Other Ambulatory Visit: Payer: Self-pay

## 2023-07-02 ENCOUNTER — Other Ambulatory Visit: Payer: Self-pay

## 2023-07-09 ENCOUNTER — Other Ambulatory Visit: Payer: Self-pay

## 2023-07-10 ENCOUNTER — Other Ambulatory Visit: Payer: Self-pay

## 2023-08-13 ENCOUNTER — Other Ambulatory Visit: Payer: Self-pay

## 2023-08-23 ENCOUNTER — Other Ambulatory Visit: Payer: Self-pay

## 2023-08-23 ENCOUNTER — Other Ambulatory Visit (HOSPITAL_COMMUNITY): Payer: Self-pay

## 2023-08-27 ENCOUNTER — Encounter (HOSPITAL_COMMUNITY): Payer: Self-pay | Admitting: Neurosurgery

## 2023-08-27 ENCOUNTER — Other Ambulatory Visit: Payer: Self-pay | Admitting: Neurosurgery

## 2023-08-27 ENCOUNTER — Other Ambulatory Visit: Payer: Self-pay

## 2023-08-27 NOTE — Progress Notes (Signed)
Anesthesia Chart Review: Shannon Gallagher  Case: [id number] Date/Time: 08/28/23 1545   Procedure: WOUND EXPLORATION, ULNAR NERVE (Left)   Anesthesia type: General   Pre-op diagnosis: POSTOP WOUND INFECTION   Location: [facility] OR ROOM 18 / [facility] OR   Surgeons: Lisbeth Renshaw, MD       DISCUSSION: Patient is a 62 year old female scheduled for the above procedure.  History includes never smoker, HTN, dyslipidemia, hyperhomocysteinemia, GERD.  Alcohol use is documented as 14 standard drinks per week.  She is followed by cardiologist Dr. Rennis Golden HTN, palpitations, high LP(a), with history of "abnormal [place] study showing a KIF6 positive genotype, as well as a hyperhomocysteinemia." She also had an elevated lipoprotein, Lp(a), and an improved dyslipidemia on Lipitor 80 mg daily  Last visit 04/09/22, but reviewed follow-up CAC on 09/04/22 that showed CAC score of 75.7 (89th percentile), up from 2015. He wrote, "CAC score of 75.7, 89th percentile for matched controls. This is an increase from zero in 2015. LP(a) is likely a factor in this. On atorvastatin and zetia. LDL is above target <70 and given elevated LP(a), there is a good argument to add PCSK9i to her regimen. I would recommend pursuing that." As of 10/04/22, she was not wanting to add another medication.   Last Reginal Lutes was on 08/27/23. Surgery does not appear elective given it is for post-operative wound infection. Case is also posted as general anesthesia.    Anesthesia team to evaluate on the day of surgery.   VS: 06/06/23 ([facility]): Blood Pressure 130/78 06/06/2023 8:33 AM EDT    Pulse 80 06/06/2023 8:12 AM EDT    Temperature 37.1 C (98.7 F) 06/06/2023 8:12 AM EDT    Respiratory Rate 16 06/06/2023 8:12 AM EDT    Oxygen Saturation - -    Inhaled Oxygen Concentration - -    Weight 78.6 kg (173 lb 3.2 oz) 06/06/2023 8:12 AM EDT    Height 166.4 cm (5' 5.5") 06/06/2023 8:12 AM EDT    Body Mass Index 28.38 06/06/2023 8:12 AM EDT      PROVIDERS: Julien Girt, PA-C is PCP  Chrystie Nose, MD is cardiologist   LABS: For day of surgery as indicated. A1c 5.7% on 06/26/23 ([facility]).   IMAGES: CT Head/C-spine 03/05/23: MPRESSION: 1. No acute intracranial abnormality. 2. No acute fracture or traumatic subluxation of the cervical spine. 3. Severe left-sided neural foraminal stenosis at C6-C7.   CXR 03/05/23: FINDINGS: The heart size and mediastinal contours are within normal limits. Both lungs are clear. The visualized skeletal structures are unremarkable. IMPRESSION: No active cardiopulmonary disease.     EKG: 03/05/23: Normal sinus rhythm Possible Left atrial enlargement Nonspecific ST abnormality Abnormal ECG Confirmed by Gloris Manchester [phone]) on 03/05/2023 10:16:46 AM   CV: CT Cardiac Calcium Scoring 09/04/22: FINDINGS: Coronary Calcium Score: Left main: 0 Left anterior descending artery: 75.7 Left circumflex artery: 0 Right coronary artery: 0 Total: 75.7 Percentile: 89th    IMPRESSION: Coronary calcium score of 75.7. This was 89th percentile for age-, race-, and sex-matched controls.   RECOMMENDATIONS:... If CAC is >=100 or >=75th percentile, it is reasonable to initiate statin therapy at any age.  Cardiology referral should be considered for patients with CAC scores >=400 or >=75th percentile...   Echo 05/30/07: Normal 2D echo Doppler study for age.   Past Medical History:  Diagnosis Date   Depression    Dyslipidemia    Positive KIF6 genotype.  Elevated LP(a)   GERD (gastroesophageal reflux disease)    Hyperhomocysteinemia (  HCC)    Hypertension     Past Surgical History:  Procedure Laterality Date   2D Echo  05/30/2007   EF > 55%, Trace MR.    AUGMENTATION MAMMAPLASTY Bilateral 11/21/2020   Exercise stress test  07/17/2007   Work load 15 METS.  No CP or EKG changes.   REDUCTION MAMMAPLASTY      MEDICATIONS: No current facility-administered medications for this  encounter.    atorvastatin (LIPITOR) 80 MG tablet   BACTRIM DS 800-160 MG tablet   buPROPion (WELLBUTRIN XL) 150 MG 24 hr tablet   cetirizine (ZYRTEC ALLERGY) 10 MG tablet   cyclobenzaprine (FLEXERIL) 5 MG tablet   diclofenac (VOLTAREN) 50 MG EC tablet   EUFLEXXA 20 MG/2ML SOSY   ezetimibe (ZETIA) 10 MG tablet   FLUoxetine (PROZAC) 40 MG capsule   fluticasone (FLOVENT HFA) 110 MCG/ACT inhaler   fluticasone furoate-vilanterol (BREO ELLIPTA) 100-25 MCG/ACT AEPB   gabapentin (NEURONTIN) 300 MG capsule   HYDROcodone-acetaminophen (NORCO/VICODIN) 5-325 MG tablet   LORazepam (ATIVAN) 1 MG tablet   naproxen (NAPROSYN) 375 MG tablet   pantoprazole (PROTONIX) 40 MG tablet   Semaglutide-Weight Management 0.5 MG/0.5ML SOAJ   telmisartan (MICARDIS) 40 MG tablet   VYVANSE 70 MG capsule   WEGOVY 1.7 MG/0.75ML SOAJ    Shonna Chock, PA-C Surgical Short Stay/Anesthesiology [facility] Phone [phone] [facility] Phone [phone] 08/27/2023 5:35 PM

## 2023-08-27 NOTE — Progress Notes (Signed)
SDW CALL  Patient was given pre-op instructions over the phone. The opportunity was given for the patient to ask questions. No further questions asked. Patient verbalized understanding of instructions given.   PCP - Tish Frederickson Cardiologist - Iantha Fallen Hilty,MD  PPM/ICD - denies Device Orders -  Rep Notified -   Chest x-ray - 03/05/23 EKG - 03/05/23 Stress Test -  ECHO - 05/30/07 Cardiac Cath - denies  Sleep Study - denies CPAP -   Fasting Blood Sugar - na Checks Blood Sugar _____ times a day  Blood Thinner Instructions:na Aspirin Instructions:na  ERAS Protcol -clears until 1300 or 3 hours prior to surgery PRE-SURGERY Ensure or G2-   COVID TEST- na   Anesthesia review: yes- Shonna Chock notified that pt took her [facility] today. Per Alison,surgery is not elective so it should be ok to proceed.   Patient denies shortness of breath, fever, cough and chest pain over the phone call    Surgical Instructions    Your procedure is scheduled on November 13  Report to [facility] Main Entrance "A" at 1330 P.M., then check in with the Admitting office.  Call this number if you have problems the morning of surgery:  [phone]    Remember:  Do not eat after midnight the night before your surgery  You may drink clear liquids until 1300 the day of your surgery.   Clear liquids allowed are: Water, Non-Citrus Juices (without pulp), Carbonated Beverages, Clear Tea, Black Coffee ONLY (NO MILK, CREAM OR POWDERED CREAMER of any kind), and Gatorade   Take these medicines the morning of surgery with A SIP OF WATER: Lipitor,Bactrim,Wellbutrin,Zetia,Prozac,Gabapentin,Flovent,Protonix. HQI:ONGEX,BMWUXL,KGMWNUUV  As of today, STOP taking any Aspirin (unless otherwise instructed by your surgeon) ,Diclofenac(Voltaren)Aleve, Naproxen, Ibuprofen, Motrin, Advil, Goody's, BC's, all herbal medications, fish oil, and all vitamins.  [facility] is not responsible for any belongings or  valuables. .   Do NOT Smoke (Tobacco/Vaping)  24 hours prior to your procedure  If you use a CPAP at night, you may bring your mask for your overnight stay.   Contacts, glasses, hearing aids, dentures or partials may not be worn into surgery, please bring cases for these belongings   Patients discharged the day of surgery will not be allowed to drive home, and someone needs to stay with them for 24 hours.   Special instructions:    Oral Hygiene is also important to reduce your risk of infection.  Remember - BRUSH YOUR TEETH THE MORNING OF SURGERY WITH YOUR REGULAR TOOTHPASTE   Day of Surgery:  Take a shower the day of or night before with antibacterial soap. Wear Clean/Comfortable clothing the morning of surgery Do not apply any deodorants/lotions.   Do not wear jewelry or makeup Do not wear lotions, powders, perfumes/colognes, or deodorant. Do not shave 48 hours prior to surgery.  Men may shave face and neck. Do not bring valuables to the hospital. Do not wear nail polish, gel polish, artificial nails, or any other type of covering on natural nails (fingers and toes) If you have artificial nails or gel coating that need to be removed by a nail salon, please have this removed prior to surgery. Artificial nails or gel coating may interfere with anesthesia's ability to adequately monitor your vital signs. Remember to brush your teeth WITH YOUR REGULAR TOOTHPASTE.

## 2023-08-27 NOTE — Anesthesia Preprocedure Evaluation (Signed)
Anesthesia Evaluation  Patient identified by MRN, date of birth, ID band Patient awake    Reviewed: Allergy & Precautions, NPO status , Patient's Chart, lab work & pertinent test results  History of Anesthesia Complications Negative for: history of anesthetic complications  Airway Mallampati: II  TM Distance: >3 FB Neck ROM: Full    Dental  (+) Dental Advisory Given, Implants   Pulmonary neg pulmonary ROS   Pulmonary exam normal        Cardiovascular hypertension, Normal cardiovascular exam     Neuro/Psych  PSYCHIATRIC DISORDERS Anxiety Depression    negative neurological ROS     GI/Hepatic ,GERD  Medicated and Controlled,,(+)     substance abuse  alcohol use  Endo/Other  negative endocrine ROS    Renal/GU negative Renal ROS     Musculoskeletal negative musculoskeletal ROS (+)    Abdominal   Peds  Hematology negative hematology ROS (+)   Anesthesia Other Findings On GLP-1a, last dose yesterday Hyperhomocysteinemia  Reproductive/Obstetrics                             Anesthesia Physical Anesthesia Plan  ASA: 2  Anesthesia Plan: General   Post-op Pain Management:    Induction: Intravenous and Rapid sequence  PONV Risk Score and Plan: 3 and Treatment may vary due to age or medical condition, Ondansetron, Dexamethasone and Midazolam  Airway Management Planned: Oral ETT  Additional Equipment: None  Intra-op Plan:   Post-operative Plan: Extubation in OR  Informed Consent: I have reviewed the patients History and Physical, chart, labs and discussed the procedure including the risks, benefits and alternatives for the proposed anesthesia with the patient or authorized representative who has indicated his/her understanding and acceptance.     Dental advisory given  Plan Discussed with: CRNA and Anesthesiologist  Anesthesia Plan Comments: (PAT note written 08/27/2023 by  Shonna Chock, PA-C.  )       Anesthesia Quick Evaluation

## 2023-08-28 ENCOUNTER — Encounter (HOSPITAL_COMMUNITY)
Admission: RE | Disposition: A | Discharge status: Home / Self Care | Payer: Self-pay | Source: Home / Self Care | Attending: Neurosurgery

## 2023-08-28 ENCOUNTER — Ambulatory Visit (HOSPITAL_COMMUNITY): Payer: Self-pay | Admitting: Vascular Surgery

## 2023-08-28 ENCOUNTER — Ambulatory Visit (HOSPITAL_COMMUNITY): Payer: BC Managed Care – PPO | Admitting: Vascular Surgery

## 2023-08-28 ENCOUNTER — Observation Stay (HOSPITAL_COMMUNITY)
Admission: RE | Admit: 2023-08-28 | Discharge: 2023-08-29 | Disposition: A | Discharge status: Home / Self Care | Payer: BC Managed Care – PPO | Attending: Neurosurgery | Admitting: Neurosurgery

## 2023-08-28 ENCOUNTER — Encounter (HOSPITAL_COMMUNITY): Payer: Self-pay | Admitting: Neurosurgery

## 2023-08-28 ENCOUNTER — Other Ambulatory Visit: Payer: Self-pay

## 2023-08-28 DIAGNOSIS — Z79899 Other long term (current) drug therapy: Secondary | ICD-10-CM | POA: Diagnosis not present

## 2023-08-28 DIAGNOSIS — T8131XA Disruption of external operation (surgical) wound, not elsewhere classified, initial encounter: Principal | ICD-10-CM | POA: Diagnosis present

## 2023-08-28 DIAGNOSIS — T8149XA Infection following a procedure, other surgical site, initial encounter: Secondary | ICD-10-CM | POA: Diagnosis present

## 2023-08-28 DIAGNOSIS — Y848 Other medical procedures as the cause of abnormal reaction of the patient, or of later complication, without mention of misadventure at the time of the procedure: Secondary | ICD-10-CM | POA: Insufficient documentation

## 2023-08-28 DIAGNOSIS — I10 Essential (primary) hypertension: Secondary | ICD-10-CM | POA: Diagnosis not present

## 2023-08-28 HISTORY — PX: WOUND EXPLORATION: SHX6188

## 2023-08-28 HISTORY — DX: Anxiety disorder, unspecified: F41.9

## 2023-08-28 HISTORY — DX: Other complications of anesthesia, initial encounter: T88.59XA

## 2023-08-28 LAB — CBC
HCT: 39.8 % (ref 36.0–46.0)
Hemoglobin: 12.7 g/dL (ref 12.0–15.0)
MCH: 30.6 pg (ref 26.0–34.0)
MCHC: 31.9 g/dL (ref 30.0–36.0)
MCV: 95.9 fL (ref 80.0–100.0)
Platelets: 319 10*3/uL (ref 150–400)
RBC: 4.15 MIL/uL (ref 3.87–5.11)
RDW: 12.4 % (ref 11.5–15.5)
WBC: 5.8 10*3/uL (ref 4.0–10.5)
nRBC: 0 % (ref 0.0–0.2)

## 2023-08-28 LAB — BASIC METABOLIC PANEL
Anion gap: 11 (ref 5–15)
BUN: 11 mg/dL (ref 6–20)
CO2: 18 mmol/L — ABNORMAL LOW (ref 22–32)
Calcium: 9.1 mg/dL (ref 8.9–10.3)
Chloride: 104 mmol/L (ref 98–111)
Creatinine, Ser: 0.99 mg/dL (ref 0.44–1.00)
GFR, Estimated: >60 mL/min (ref >60)
Glucose, Bld: 102 mg/dL — ABNORMAL HIGH (ref 70–99)
Potassium: 3.9 mmol/L (ref 3.5–5.1)
Sodium: 133 mmol/L — ABNORMAL LOW (ref 135–145)

## 2023-08-28 SURGERY — WOUND EXPLORATION
Anesthesia: General | Laterality: Left

## 2023-08-28 MED ORDER — LISDEXAMFETAMINE DIMESYLATE 70 MG PO CAPS
70.0000 mg | ORAL_CAPSULE | Freq: Every day | ORAL | Status: DC
Start: 2023-08-29 — End: 2023-08-29
  Administered 2023-08-29: 70 mg via ORAL
  Filled 2023-08-28: qty 1

## 2023-08-28 MED ORDER — LIDOCAINE 2% (20 MG/ML) 5 ML SYRINGE
INTRAMUSCULAR | Status: DC | PRN
  Administered 2023-08-28: 60 mg via INTRAVENOUS

## 2023-08-28 MED ORDER — ROCURONIUM BROMIDE 10 MG/ML (PF) SYRINGE
PREFILLED_SYRINGE | INTRAVENOUS | Status: AC
  Filled 2023-08-28: qty 10

## 2023-08-28 MED ORDER — OXYCODONE HCL 5 MG PO TABS: 5.0000 mg | ORAL_TABLET | Freq: Once | ORAL | Status: DC | PRN

## 2023-08-28 MED ORDER — CHLORHEXIDINE GLUCONATE 0.12 % MT SOLN: 15.0000 mL | Freq: Once | OROMUCOSAL | Status: AC

## 2023-08-28 MED ORDER — CEFAZOLIN SODIUM-DEXTROSE 2-4 GM/100ML-% IV SOLN
2.0000 g | INTRAVENOUS | Status: AC
  Administered 2023-08-28: 2 g via INTRAVENOUS

## 2023-08-28 MED ORDER — OXYCODONE HCL 5 MG/5ML PO SOLN: 5.0000 mg | Freq: Once | ORAL | Status: DC | PRN

## 2023-08-28 MED ORDER — SUCCINYLCHOLINE CHLORIDE 200 MG/10ML IV SOSY
PREFILLED_SYRINGE | INTRAVENOUS | Status: AC
Start: 2023-08-28 — End: ?
  Filled 2023-08-28: qty 10

## 2023-08-28 MED ORDER — SUCCINYLCHOLINE CHLORIDE 200 MG/10ML IV SOSY
PREFILLED_SYRINGE | INTRAVENOUS | Status: DC | PRN
  Administered 2023-08-28: 120 mg via INTRAVENOUS

## 2023-08-28 MED ORDER — ONDANSETRON HCL 4 MG/2ML IJ SOLN
INTRAMUSCULAR | Status: AC
Start: 2023-08-28 — End: ?
  Filled 2023-08-28: qty 2

## 2023-08-28 MED ORDER — ACETAMINOPHEN 325 MG PO TABS: 650.0000 mg | ORAL_TABLET | ORAL | Status: DC | PRN

## 2023-08-28 MED ORDER — IRBESARTAN 150 MG PO TABS
150.0000 mg | ORAL_TABLET | Freq: Every day | ORAL | Status: DC
  Administered 2023-08-29: 150 mg via ORAL
  Filled 2023-08-28: qty 1

## 2023-08-28 MED ORDER — MIDAZOLAM HCL 2 MG/2ML IJ SOLN
INTRAMUSCULAR | Status: AC
  Filled 2023-08-28: qty 2

## 2023-08-28 MED ORDER — CHLORHEXIDINE GLUCONATE CLOTH 2 % EX PADS: 6.0000 | MEDICATED_PAD | Freq: Once | CUTANEOUS | Status: DC

## 2023-08-28 MED ORDER — SODIUM CHLORIDE 0.9% FLUSH: 3.0000 mL | INTRAVENOUS | Status: DC | PRN

## 2023-08-28 MED ORDER — CEFAZOLIN SODIUM-DEXTROSE 2-4 GM/100ML-% IV SOLN
2.0000 g | Freq: Three times a day (TID) | INTRAVENOUS | Status: AC
  Administered 2023-08-28 – 2023-08-29 (×2): 2 g via INTRAVENOUS
  Filled 2023-08-28 (×2): qty 100

## 2023-08-28 MED ORDER — LIDOCAINE 2% (20 MG/ML) 5 ML SYRINGE
INTRAMUSCULAR | Status: AC
  Filled 2023-08-28: qty 5

## 2023-08-28 MED ORDER — FENTANYL CITRATE (PF) 250 MCG/5ML IJ SOLN
INTRAMUSCULAR | Status: AC
  Filled 2023-08-28: qty 5

## 2023-08-28 MED ORDER — DEXAMETHASONE SODIUM PHOSPHATE 10 MG/ML IJ SOLN
INTRAMUSCULAR | Status: AC
  Filled 2023-08-28: qty 1

## 2023-08-28 MED ORDER — CHLORHEXIDINE GLUCONATE 0.12 % MT SOLN
OROMUCOSAL | Status: AC
  Administered 2023-08-28: 15 mL via OROMUCOSAL
  Filled 2023-08-28: qty 15

## 2023-08-28 MED ORDER — FENTANYL CITRATE (PF) 100 MCG/2ML IJ SOLN: 25.0000 ug | INTRAMUSCULAR | Status: DC | PRN

## 2023-08-28 MED ORDER — LORAZEPAM 1 MG PO TABS: 1.0000 mg | ORAL_TABLET | Freq: Every day | ORAL | Status: DC | PRN

## 2023-08-28 MED ORDER — PROPOFOL 10 MG/ML IV BOLUS
INTRAVENOUS | Status: AC
  Filled 2023-08-28: qty 20

## 2023-08-28 MED ORDER — ACETAMINOPHEN 650 MG RE SUPP: 650.0000 mg | RECTAL | Status: DC | PRN

## 2023-08-28 MED ORDER — MIDAZOLAM HCL 2 MG/2ML IJ SOLN
INTRAMUSCULAR | Status: DC | PRN
  Administered 2023-08-28: 2 mg via INTRAVENOUS

## 2023-08-28 MED ORDER — BUPROPION HCL ER (XL) 150 MG PO TB24
150.0000 mg | ORAL_TABLET | Freq: Every day | ORAL | Status: DC
  Administered 2023-08-29: 150 mg via ORAL
  Filled 2023-08-28: qty 1

## 2023-08-28 MED ORDER — SODIUM HYALURONATE (VISCOSUP) 20 MG/2ML IX SOSY: 2.0000 mL | PREFILLED_SYRINGE | INTRA_ARTICULAR | Status: DC

## 2023-08-28 MED ORDER — ONDANSETRON HCL 4 MG/2ML IJ SOLN: 4.0000 mg | Freq: Four times a day (QID) | INTRAMUSCULAR | Status: DC | PRN

## 2023-08-28 MED ORDER — HYDROCODONE-ACETAMINOPHEN 5-325 MG PO TABS
1.0000 | ORAL_TABLET | Freq: Four times a day (QID) | ORAL | Status: DC | PRN
  Administered 2023-08-28 – 2023-08-29 (×2): 1 via ORAL
  Filled 2023-08-28 (×2): qty 1

## 2023-08-28 MED ORDER — CEFAZOLIN SODIUM-DEXTROSE 2-4 GM/100ML-% IV SOLN
INTRAVENOUS | Status: AC
  Filled 2023-08-28: qty 100

## 2023-08-28 MED ORDER — PHENYLEPHRINE 80 MCG/ML (10ML) SYRINGE FOR IV PUSH (FOR BLOOD PRESSURE SUPPORT)
PREFILLED_SYRINGE | INTRAVENOUS | Status: DC | PRN
  Administered 2023-08-28 (×3): 160 ug via INTRAVENOUS
  Administered 2023-08-28: 80 ug via INTRAVENOUS
  Administered 2023-08-28: 160 ug via INTRAVENOUS

## 2023-08-28 MED ORDER — CYCLOBENZAPRINE HCL 5 MG PO TABS: 5.0000 mg | ORAL_TABLET | Freq: Every day | ORAL | Status: DC | PRN

## 2023-08-28 MED ORDER — DEXAMETHASONE SODIUM PHOSPHATE 10 MG/ML IJ SOLN
INTRAMUSCULAR | Status: DC | PRN
  Administered 2023-08-28: 10 mg via INTRAVENOUS

## 2023-08-28 MED ORDER — ACETAMINOPHEN 500 MG PO TABS
1000.0000 mg | ORAL_TABLET | Freq: Once | ORAL | Status: AC
  Administered 2023-08-28: 1000 mg via ORAL
  Filled 2023-08-28: qty 2

## 2023-08-28 MED ORDER — GABAPENTIN 300 MG PO CAPS: 300.0000 mg | ORAL_CAPSULE | Freq: Every evening | ORAL | Status: DC | PRN

## 2023-08-28 MED ORDER — FLUOXETINE HCL 20 MG PO CAPS
40.0000 mg | ORAL_CAPSULE | Freq: Every day | ORAL | Status: DC
  Administered 2023-08-29: 40 mg via ORAL
  Filled 2023-08-28: qty 2

## 2023-08-28 MED ORDER — ATORVASTATIN CALCIUM 80 MG PO TABS
80.0000 mg | ORAL_TABLET | Freq: Every day | ORAL | Status: DC
  Administered 2023-08-29: 80 mg via ORAL
  Filled 2023-08-28: qty 1

## 2023-08-28 MED ORDER — FENTANYL CITRATE (PF) 250 MCG/5ML IJ SOLN
INTRAMUSCULAR | Status: DC | PRN
  Administered 2023-08-28: 100 ug via INTRAVENOUS
  Administered 2023-08-28: 50 ug via INTRAVENOUS

## 2023-08-28 MED ORDER — EZETIMIBE 10 MG PO TABS
10.0000 mg | ORAL_TABLET | Freq: Every day | ORAL | Status: DC
  Administered 2023-08-29: 10 mg via ORAL
  Filled 2023-08-28: qty 1

## 2023-08-28 MED ORDER — ONDANSETRON HCL 4 MG PO TABS: 4.0000 mg | ORAL_TABLET | Freq: Four times a day (QID) | ORAL | Status: DC | PRN

## 2023-08-28 MED ORDER — KETOROLAC TROMETHAMINE 30 MG/ML IJ SOLN
INTRAMUSCULAR | Status: AC
  Filled 2023-08-28: qty 1

## 2023-08-28 MED ORDER — ONDANSETRON HCL 4 MG/2ML IJ SOLN
INTRAMUSCULAR | Status: AC
  Filled 2023-08-28: qty 2

## 2023-08-28 MED ORDER — SODIUM CHLORIDE 0.9 % IV SOLN
250.0000 mL | INTRAVENOUS | Status: DC
Start: 2023-08-28 — End: 2023-08-29

## 2023-08-28 MED ORDER — EPHEDRINE 5 MG/ML INJ
INTRAVENOUS | Status: AC
  Filled 2023-08-28: qty 5

## 2023-08-28 MED ORDER — SODIUM CHLORIDE 0.9% FLUSH
3.0000 mL | Freq: Two times a day (BID) | INTRAVENOUS | Status: DC
  Administered 2023-08-28: 3 mL via INTRAVENOUS

## 2023-08-28 MED ORDER — AMISULPRIDE (ANTIEMETIC) 5 MG/2ML IV SOLN: 10.0000 mg | Freq: Once | INTRAVENOUS | Status: DC | PRN

## 2023-08-28 MED ORDER — LACTATED RINGERS IV SOLN: INTRAVENOUS | Status: DC

## 2023-08-28 MED ORDER — BACITRACIN ZINC 500 UNIT/GM EX OINT
TOPICAL_OINTMENT | CUTANEOUS | Status: DC | PRN
  Administered 2023-08-28: 1 via TOPICAL

## 2023-08-28 MED ORDER — 0.9 % SODIUM CHLORIDE (POUR BTL) OPTIME
TOPICAL | Status: DC | PRN
  Administered 2023-08-28: 1000 mL

## 2023-08-28 MED ORDER — PROPOFOL 10 MG/ML IV BOLUS
INTRAVENOUS | Status: DC | PRN
  Administered 2023-08-28: 200 mg via INTRAVENOUS

## 2023-08-28 MED ORDER — EPHEDRINE SULFATE-NACL 50-0.9 MG/10ML-% IV SOSY
PREFILLED_SYRINGE | INTRAVENOUS | Status: DC | PRN
  Administered 2023-08-28 (×2): 10 mg via INTRAVENOUS
  Administered 2023-08-28: 15 mg via INTRAVENOUS

## 2023-08-28 MED ORDER — EPHEDRINE 5 MG/ML INJ
INTRAVENOUS | Status: AC
Start: 2023-08-28 — End: ?
  Filled 2023-08-28: qty 5

## 2023-08-28 MED ORDER — BUDESONIDE 0.25 MG/2ML IN SUSP
0.2500 mg | Freq: Two times a day (BID) | RESPIRATORY_TRACT | Status: DC
  Filled 2023-08-28 (×3): qty 2

## 2023-08-28 MED ORDER — PHENOL 1.4 % MT LIQD: 1.0000 | OROMUCOSAL | Status: DC | PRN

## 2023-08-28 MED ORDER — BACITRACIN ZINC 500 UNIT/GM EX OINT
TOPICAL_OINTMENT | CUTANEOUS | Status: AC
  Filled 2023-08-28: qty 28.35

## 2023-08-28 MED ORDER — ONDANSETRON HCL 4 MG/2ML IJ SOLN
INTRAMUSCULAR | Status: DC | PRN
  Administered 2023-08-28: 4 mg via INTRAVENOUS

## 2023-08-28 MED ORDER — MENTHOL 3 MG MT LOZG: 1.0000 | LOZENGE | OROMUCOSAL | Status: DC | PRN

## 2023-08-28 MED ORDER — ORAL CARE MOUTH RINSE: 15.0000 mL | Freq: Once | OROMUCOSAL | Status: AC

## 2023-08-28 MED ORDER — PANTOPRAZOLE SODIUM 40 MG PO TBEC
40.0000 mg | DELAYED_RELEASE_TABLET | Freq: Every day | ORAL | Status: DC
  Administered 2023-08-29: 40 mg via ORAL
  Filled 2023-08-28: qty 1

## 2023-08-28 MED ORDER — SODIUM CHLORIDE 0.9 % IV SOLN: 12.5000 mg | INTRAVENOUS | Status: DC | PRN

## 2023-08-28 MED ORDER — DEXAMETHASONE SODIUM PHOSPHATE 10 MG/ML IJ SOLN
INTRAMUSCULAR | Status: AC
Start: 2023-08-28 — End: ?
  Filled 2023-08-28: qty 1

## 2023-08-28 SURGICAL SUPPLY — 36 items
BAG COUNTER SPONGE SURGICOUNT (BAG) ×1 IMPLANT
BNDG ELASTIC 3INX 5YD STR LF (GAUZE/BANDAGES/DRESSINGS) IMPLANT
BNDG GAUZE DERMACEA FLUFF 4 (GAUZE/BANDAGES/DRESSINGS) IMPLANT
CANISTER SUCT 3000ML PPV (MISCELLANEOUS) ×1 IMPLANT
DRAPE LAPAROTOMY 100X72X124 (DRAPES) ×1 IMPLANT
DRAPE SURG ORHT 6 SPLT 77X108 (DRAPES) IMPLANT
ELECT REM PT RETURN 9FT ADLT (ELECTROSURGICAL) ×1
ELECTRODE REM PT RTRN 9FT ADLT (ELECTROSURGICAL) ×1 IMPLANT
GAUZE 4X4 16PLY ~~LOC~~+RFID DBL (SPONGE) IMPLANT
GAUZE SPONGE 4X4 12PLY STRL (GAUZE/BANDAGES/DRESSINGS) IMPLANT
GAUZE XEROFORM 1X8 LF (GAUZE/BANDAGES/DRESSINGS) IMPLANT
GLOVE BIOGEL PI IND STRL 7.5 (GLOVE) ×1 IMPLANT
GLOVE ECLIPSE 7.0 STRL STRAW (GLOVE) ×1 IMPLANT
GLOVE EXAM NITRILE XL STR (GLOVE) IMPLANT
GOWN STRL REUS W/ TWL LRG LVL3 (GOWN DISPOSABLE) ×1 IMPLANT
GOWN STRL REUS W/ TWL XL LVL3 (GOWN DISPOSABLE) IMPLANT
GOWN STRL REUS W/TWL 2XL LVL3 (GOWN DISPOSABLE) IMPLANT
GOWN STRL REUS W/TWL LRG LVL3 (GOWN DISPOSABLE) ×1
GOWN STRL REUS W/TWL XL LVL3 (GOWN DISPOSABLE)
KIT BASIN OR (CUSTOM PROCEDURE TRAY) ×1 IMPLANT
KIT TURNOVER KIT B (KITS) ×1 IMPLANT
NDL HYPO 22X1.5 SAFETY MO (MISCELLANEOUS) ×1 IMPLANT
NEEDLE HYPO 22X1.5 SAFETY MO (MISCELLANEOUS) ×1 IMPLANT
NS IRRIG 1000ML POUR BTL (IV SOLUTION) ×1 IMPLANT
PACK LAMINECTOMY NEURO (CUSTOM PROCEDURE TRAY) ×1 IMPLANT
PAD ARMBOARD 7.5X6 YLW CONV (MISCELLANEOUS) ×3 IMPLANT
SPONGE SURGIFOAM ABS GEL SZ50 (HEMOSTASIS) IMPLANT
SUT ETHILON 3 0 FSL (SUTURE) IMPLANT
SUT VIC AB 0 CT1 18XCR BRD8 (SUTURE) ×1 IMPLANT
SUT VIC AB 0 CT1 8-18 (SUTURE)
SUT VICRYL 3-0 RB1 18 ABS (SUTURE) ×2 IMPLANT
SWAB COLLECTION DEVICE MRSA (MISCELLANEOUS) IMPLANT
SWAB CULTURE ESWAB REG 1ML (MISCELLANEOUS) IMPLANT
TOWEL GREEN STERILE (TOWEL DISPOSABLE) ×1 IMPLANT
TOWEL GREEN STERILE FF (TOWEL DISPOSABLE) ×1 IMPLANT
WATER STERILE IRR 1000ML POUR (IV SOLUTION) ×1 IMPLANT

## 2023-08-28 NOTE — Transfer of Care (Signed)
Immediate Anesthesia Transfer of Care Note  Patient: Shannon Gallagher  Procedure(s) Performed: WOUND EXPLORATION, ULNAR NERVE (Left)  Patient Location: PACU  Anesthesia Type:General  Level of Consciousness: drowsy and patient cooperative  Airway & Oxygen Therapy: Patient Spontanous Breathing and Patient connected to face mask oxygen  Post-op Assessment: Report given to RN and Post -op Vital signs reviewed and stable  Post vital signs: Reviewed and stable  Last Vitals:  Vitals Value Taken Time  BP 122/79 08/28/23 1745  Temp    Pulse 72 08/28/23 1749  Resp 10 08/28/23 1749  SpO2 95 % 08/28/23 1749  Vitals shown include unfiled device data.  Last Pain:  Vitals:   08/28/23 1429  PainSc: 0-No pain      Patients Stated Pain Goal: 3 (08/28/23 1429)  Complications: No notable events documented.

## 2023-08-28 NOTE — Anesthesia Procedure Notes (Signed)
Procedure Name: Intubation Date/Time: 08/28/2023 4:44 PM  Performed by: Orlin Hilding, CRNAPre-anesthesia Checklist: Patient identified, Emergency Drugs available, Suction available, Patient being monitored and Timeout performed Patient Re-evaluated:Patient Re-evaluated prior to induction Oxygen Delivery Method: Circle system utilized Preoxygenation: Pre-oxygenation with 100% oxygen Induction Type: IV induction and Rapid sequence Laryngoscope Size: Mac and 3 Grade View: Grade I Tube type: Oral Tube size: 7.0 mm Number of attempts: 1 Placement Confirmation: ETT inserted through vocal cords under direct vision, positive ETCO2 and breath sounds checked- equal and bilateral Secured at: 22 cm Tube secured with: Tape Dental Injury: Teeth and Oropharynx as per pre-operative assessment

## 2023-08-28 NOTE — H&P (Signed)
Chief Complaint   wound infection  History of Present Illness  Shannon Gallagher is a 61 y.o. female Who underwent left ulnar nerve decompression about two weeks ago. She was complaining of redness around the wound, tenderness along the incision, and some drainage. She was seen in the outpatient clinic and started on oral antibiotics. Unfortunately, she reported worsening drainage and therefore presents for wound exploration and washout.  Past Medical History  Past Medical History:  Diagnosis Date  . Depression   . Dyslipidemia    Positive KIF6 genotype.  Elevated LP(a)  . GERD (gastroesophageal reflux disease)   . Hyperhomocysteinemia (HCC)   . Hypertension     Past Surgical History  Past Surgical History:  Procedure Laterality Date  . 2D Echo  05/30/2007   EF > 55%, Trace MR.   Marland Kitchen AUGMENTATION MAMMAPLASTY Bilateral 11/21/2020  . CARPAL TUNNEL RELEASE Right   . CARPAL TUNNEL RELEASE Left   . Exercise stress test  07/17/2007   Work load 15 METS.  No CP or EKG changes.  Marland Kitchen MENISCUS REPAIR Left   . MENISCUS REPAIR Right   . REDUCTION MAMMAPLASTY      Social History  Social History   Tobacco Use  . Smoking status: Never  . Smokeless tobacco: Never  Substance Use Topics  . Alcohol use: Yes    Alcohol/week: 14.0 standard drinks of alcohol    Types: 14 Glasses of wine per week  . Drug use: No    Medications   Prior to Admission medications   Medication Sig Start Date End Date Taking? Authorizing Provider  WEGOVY 1.7 MG/0.75ML SOAJ SMARTSIG:1.7 Milligram(s) SUB-Q Once a Week   Yes [provider]  atorvastatin (LIPITOR) 80 MG tablet TAKE 1 TABLET DAILY (NEEDS TO SCHEDULE AN APPOINTMENT WITH CARDIOLOGY BEFORE RECEIVING FUTURE REFILLS) 06/05/23   Hilty, Lisette Abu, MD  BACTRIM DS 800-160 MG tablet Take by mouth. 08/23/23 09/01/23  [provider]  buPROPion (WELLBUTRIN XL) 150 MG 24 hr tablet Take 150 mg by mouth daily.    [provider]   cetirizine (ZYRTEC ALLERGY) 10 MG tablet Take 1 tablet (10 mg total) by mouth at bedtime. 12/25/21 04/09/22  Theadora Rama Scales, PA-C  cyclobenzaprine (FLEXERIL) 5 MG tablet Take by mouth. 03/28/23   [provider]  diclofenac (VOLTAREN) 50 MG EC tablet Take 50 mg by mouth 2 (two) times daily with a meal. 03/16/21   [provider]  EUFLEXXA 20 MG/2ML SOSY SMARTSIG:1 Syringe(s) I-ARTIC Once a Week 09/22/21   [provider]  ezetimibe (ZETIA) 10 MG tablet TAKE 1 TABLET(10 MG) BY MOUTH DAILY 02/06/23   Hilty, Lisette Abu, MD  FLUoxetine (PROZAC) 40 MG capsule Take 1 capsule by mouth daily.    [provider]  fluticasone (FLOVENT HFA) 110 MCG/ACT inhaler Inhale into the lungs. 05/04/21   [provider]  fluticasone furoate-vilanterol (BREO ELLIPTA) 100-25 MCG/ACT AEPB Inhale 1 puff and swallow(not for asthma) once daily. 06/06/23   Charna Elizabeth, MD  gabapentin (NEURONTIN) 300 MG capsule Take by mouth. 06/03/23   [provider]  HYDROcodone-acetaminophen (NORCO/VICODIN) 5-325 MG tablet Take by mouth. 08/13/23   [provider]  LORazepam (ATIVAN) 1 MG tablet Take 1 mg by mouth as needed.    [provider]  naproxen (NAPROSYN) 375 MG tablet Take 1 tablet (375 mg total) by mouth 2 (two) times daily with a meal. 06/17/22   Wallis Bamberg, PA-C  pantoprazole (PROTONIX) 40 MG tablet Take 40  mg by mouth daily. 09/14/21   [provider]  Semaglutide-Weight Management 0.5 MG/0.5ML SOAJ Inject 0.5 mg into the skin once a week. 05/22/23   Shepperson, Kirstin, PA-C  telmisartan (MICARDIS) 40 MG tablet TAKE 1 TABLET(40 MG) BY MOUTH DAILY 03/07/23   Hilty, Lisette Abu, MD  VYVANSE 70 MG capsule Take 1 capsule by mouth daily. 12/04/16   [provider]    Allergies  No Known Allergies  Review of Systems  ROS  Neurologic Exam  Awake, alert, oriented speech fluent CN intact Good strength LUE Wound with nylon stitches, crusting  and erythema, tender  Imaging  none  Impression  - 62 y.o. female 2wks s/p left ulnar nerve decompression with wound infection  Plan  - will proceed with wound exploration/washout.  I have reviewed the details of the operation and the expected postoperative course and recovery with the patient in the office. All her questions were answered. She provided informed consent to proceed.

## 2023-08-28 NOTE — Op Note (Signed)
  NEUROSURGERY OPERATIVE NOTE   PREOP DIAGNOSIS:  Left elbow wound infection   POSTOP DIAGNOSIS: Same  PROCEDURE: Wound exploration and washout  SURGEON: Dr. Lisbeth Renshaw, MD  ASSISTANT: None  ANESTHESIA: General Endotracheal  EBL: Minimal  SPECIMENS: Wound anaerobic/aerobic culture swab  DRAINS: None  COMPLICATIONS: None   CONDITION: Stable to PACU  HISTORY: Shannon Gallagher is a 61 y.o. female who underwent left ulnar nerve decompression about 2 weeks ago.  She presented back to the outpatient clinic with some swelling of the wound as well as some mild amount of purulent drainage.  We started her on oral antibiotics however she contacted [place] reporting worsening drainage.  She therefore presents for wound exploration and washout.  PROCEDURE IN DETAIL: The patient was brought to the operating room. After induction of general anesthesia, the patient was positioned on the operative table in the supine position. All pressure points were meticulously padded.  The left upper extremity was then prepped and draped in the usual sterile fashion.  After timeout was conducted, the previously placed vertical mattress nylon stitches were sequentially cut.  The tissue surrounding the deep portion of the stitches appear to be denuded, with some granulation tissue surrounding the suture entry sites.  There did seem to be a fair bit of tension around the stitches possibly related to the associated swelling.  After stitches were cut, the wound was opened sharply with a 10 blade scalpel.  The underlying subcutaneous tissue appeared to be somewhat edematous.  I did not identify any pockets of pus.  Aerobic and anaerobic culture swabs were taken of the subcutaneous region.  The wound was explored and again, no pus was identified.  The wound was irrigated with normal saline.  There was some minor bleeding from the skin edges, otherwise no active bleeding was identified.  Wound was then closed with a  running 4-0 nylon stitch.  Bacitracin ointment, Xeroform dressing, 4 x 4 fluffs, Kerlix, and an Ace wrap were placed.  At the end of the case all sponge, needle, and instrument counts were correct. The patient was then transferred to the stretcher, extubated, and taken to the post-anesthesia care unit in stable hemodynamic condition.   Lisbeth Renshaw, MD [facility]

## 2023-08-28 NOTE — Plan of Care (Signed)

## 2023-08-29 ENCOUNTER — Encounter (HOSPITAL_COMMUNITY): Payer: Self-pay | Admitting: Neurosurgery

## 2023-08-29 ENCOUNTER — Other Ambulatory Visit: Payer: Self-pay | Admitting: Neurosurgery

## 2023-08-29 DIAGNOSIS — T8149XA Infection following a procedure, other surgical site, initial encounter: Secondary | ICD-10-CM | POA: Diagnosis not present

## 2023-08-29 NOTE — Discharge Summary (Signed)
Physician Discharge Summary  Patient ID: BRINDLE BORTON MRN: [id number] DOB/AGE: 1961/06/01 62 y.o.  Admit date: 08/28/2023 Discharge date: 08/29/2023  Admission Diagnoses:  Postop wound infection  Discharge Diagnoses:  Same Principal Problem:   Postoperative wound breakdown   Discharged Condition: Stable  Hospital Course:  Shannon Gallagher is a 62 y.o. female admitted after left elbow wound exploration/washout. She was at baseline on POD#1 and discharged in stable condition.  Treatments: Surgery - left elbow wound washout  Discharge Exam: Blood pressure 126/82, pulse 71, temperature 98.6 F (37 C), temperature source Oral, resp. rate 16, height 5' 5.5" (1.664 m), weight 73.5 kg, SpO2 97%. Awake, alert, oriented Speech fluent, appropriate CN grossly intact 5/5 BUE/BLE Wound c/d/i  Disposition: Discharge disposition: 01-Home or Self Care       Discharge Instructions     Call MD for:  redness, tenderness, or signs of infection (pain, swelling, redness, odor or green/yellow discharge around incision site)   Complete by: As directed    Call MD for:  temperature >100.4   Complete by: As directed    Diet - low sodium heart healthy   Complete by: As directed    Discharge instructions   Complete by: As directed    Walk at home as much as possible, at least 4 times / day   Incentive spirometry RT   Complete by: As directed    Increase activity slowly   Complete by: As directed    Lifting restrictions   Complete by: As directed    No lifting > 10 lbs   May shower / Bathe   Complete by: As directed    48 hours after surgery   May walk up steps   Complete by: As directed    Other Restrictions   Complete by: As directed    No bending/twisting at waist   Remove dressing in 48 hours   Complete by: As directed       Allergies as of 08/29/2023       Reactions   Tape Itching   Paper tape okay        Medication List     TAKE these medications     atorvastatin 80 MG tablet Commonly known as: LIPITOR TAKE 1 TABLET DAILY (NEEDS TO SCHEDULE AN APPOINTMENT WITH CARDIOLOGY BEFORE RECEIVING FUTURE REFILLS)   Bactrim DS 800-160 MG tablet Generic drug: sulfamethoxazole-trimethoprim Take 1 tablet by mouth 2 (two) times daily.   buPROPion 150 MG 24 hr tablet Commonly known as: WELLBUTRIN XL Take 150 mg by mouth daily.   cetirizine 10 MG tablet Commonly known as: ZyrTEC Allergy Take 1 tablet (10 mg total) by mouth at bedtime.   cyclobenzaprine 5 MG tablet Commonly known as: FLEXERIL Take 5 mg by mouth daily as needed for muscle spasms.   Euflexxa 20 MG/2ML Sosy Generic drug: Sodium Hyaluronate (Viscosup) Inject 2 mLs into the skin every 6 (six) months.   ezetimibe 10 MG tablet Commonly known as: ZETIA TAKE 1 TABLET(10 MG) BY MOUTH DAILY   FLUoxetine 40 MG capsule Commonly known as: PROZAC Take 1 capsule by mouth daily.   fluticasone 110 MCG/ACT inhaler Commonly known as: FLOVENT HFA Inhale 1 puff into the lungs daily as needed (for shortness of breath).   gabapentin 300 MG capsule Commonly known as: NEURONTIN Take 300 mg by mouth at bedtime as needed (for pain).   HYDROcodone-acetaminophen 5-325 MG tablet Commonly known as: NORCO/VICODIN Take 1 tablet by mouth every 6 (six) hours as  needed for moderate pain (pain score 4-6).   LORazepam 1 MG tablet Commonly known as: ATIVAN Take 1 mg by mouth daily as needed for anxiety.   pantoprazole 40 MG tablet Commonly known as: PROTONIX Take 40 mg by mouth daily.   telmisartan 40 MG tablet Commonly known as: MICARDIS TAKE 1 TABLET(40 MG) BY MOUTH DAILY   Vyvanse 70 MG capsule Generic drug: lisdexamfetamine Take 1 capsule by mouth daily.   Wegovy 1.7 MG/0.75ML Soaj Generic drug: Semaglutide-Weight Management SMARTSIG:1.7 Milligram(s) SUB-Q Once a Week        Follow-up Information     Lisbeth Renshaw, MD Follow up on 09/02/2023.   Specialty:  Neurosurgery Contact information: [address] [place] [address] [phone]                 Signed: Jackelyn Hoehn 08/29/2023, 11:28 AM

## 2023-08-29 NOTE — Anesthesia Postprocedure Evaluation (Signed)
Anesthesia Post Note  Patient: Shannon Gallagher  Procedure(s) Performed: WOUND EXPLORATION, ULNAR NERVE (Left)     Patient location during evaluation: PACU Anesthesia Type: General Level of consciousness: awake and alert Pain management: pain level controlled Vital Signs Assessment: post-procedure vital signs reviewed and stable Respiratory status: spontaneous breathing, nonlabored ventilation and respiratory function stable Cardiovascular status: stable and blood pressure returned to baseline Anesthetic complications: no   No notable events documented.  Last Vitals:  Vitals:   08/28/23 2318 08/29/23 0441  BP: 107/69 104/72  Pulse: 70 67  Resp: 18 18  Temp: 36.8 C 36.8 C  SpO2: 95% 97%    Last Pain:  Vitals:   08/29/23 0441  TempSrc: Oral  PainSc:    Pain Goal: Patients Stated Pain Goal: 2 (08/28/23 2200)                 Beryle Lathe

## 2023-08-29 NOTE — Plan of Care (Signed)
  Problem: Education: Goal: Knowledge of General Education information will improve Description: Including pain rating scale, medication(s)/side effects and non-pharmacologic comfort measures Outcome: Completed/Met   Problem: Health Behavior/Discharge Planning: Goal: Ability to manage health-related needs will improve Outcome: Completed/Met   Problem: Clinical Measurements: Goal: Ability to maintain clinical measurements within normal limits will improve Outcome: Completed/Met Goal: Will remain free from infection Outcome: Completed/Met Goal: Diagnostic test results will improve Outcome: Completed/Met Goal: Respiratory complications will improve Outcome: Completed/Met Goal: Cardiovascular complication will be avoided Outcome: Completed/Met   Problem: Activity: Goal: Risk for activity intolerance will decrease Outcome: Completed/Met   Problem: Nutrition: Goal: Adequate nutrition will be maintained Outcome: Completed/Met   Problem: Coping: Goal: Level of anxiety will decrease Outcome: Completed/Met   Problem: Elimination: Goal: Will not experience complications related to bowel motility Outcome: Completed/Met   Problem: Pain Management: Goal: General experience of comfort will improve Outcome: Completed/Met   Problem: Safety: Goal: Ability to remain free from injury will improve Outcome: Completed/Met   Problem: Skin Integrity: Goal: Risk for impaired skin integrity will decrease Outcome: Completed/Met   Problem: Education: Goal: Ability to verbalize activity precautions or restrictions will improve Outcome: Completed/Met Goal: Knowledge of the prescribed therapeutic regimen will improve Outcome: Completed/Met Goal: Understanding of discharge needs will improve Outcome: Completed/Met   Problem: Activity: Goal: Ability to avoid complications of mobility impairment will improve Outcome: Completed/Met Goal: Ability to tolerate increased activity will  improve Outcome: Completed/Met Goal: Will remain free from falls Outcome: Completed/Met   Problem: Pain Management: Goal: Pain level will decrease Outcome: Completed/Met   Problem: Clinical Measurements: Goal: Ability to maintain clinical measurements within normal limits will improve Outcome: Completed/Met Goal: Postoperative complications will be avoided or minimized Outcome: Completed/Met Goal: Diagnostic test results will improve Outcome: Completed/Met   Problem: Bowel/Gastric: Goal: Gastrointestinal status for postoperative course will improve Outcome: Completed/Met   Problem: Bladder/Genitourinary: Goal: Urinary functional status for postoperative course will improve Outcome: Completed/Met   Problem: Health Behavior/Discharge Planning: Goal: Identification of resources available to assist in meeting health care needs will improve Outcome: Completed/Met

## 2023-08-29 NOTE — Progress Notes (Signed)
Patient alert and oriented, void, ambulate. D/c instructions explain and given to the patient. Surgical site clean and dry no sign of infection.

## 2023-09-02 LAB — AEROBIC/ANAEROBIC CULTURE W GRAM STAIN (SURGICAL/DEEP WOUND)

## 2023-09-03 ENCOUNTER — Other Ambulatory Visit: Payer: Self-pay

## 2023-09-03 DIAGNOSIS — I10 Essential (primary) hypertension: Secondary | ICD-10-CM

## 2023-09-03 MED ORDER — TELMISARTAN 40 MG PO TABS: 40.0000 mg | ORAL_TABLET | Freq: Every day | ORAL | 0 refills | Status: DC

## 2023-09-20 ENCOUNTER — Other Ambulatory Visit: Payer: Self-pay

## 2023-09-20 MED ORDER — COVID-19 MRNA VAC-TRIS(PFIZER) 30 MCG/0.3ML IM SUSY
0.3000 mL | PREFILLED_SYRINGE | Freq: Once | INTRAMUSCULAR | 0 refills | Status: AC
  Filled 2023-09-20: qty 0.3, 1d supply, fill #0

## 2023-09-20 MED ORDER — INFLUENZA VIRUS VACC SPLIT PF (FLUZONE) 0.5 ML IM SUSY
0.5000 mL | PREFILLED_SYRINGE | Freq: Once | INTRAMUSCULAR | 0 refills | Status: AC
  Filled 2023-09-20: qty 0.5, 1d supply, fill #0

## 2023-09-20 MED ORDER — EZETIMIBE 10 MG PO TABS: 10.0000 mg | ORAL_TABLET | Freq: Every day | ORAL | 0 refills | Status: DC

## 2023-12-24 ENCOUNTER — Other Ambulatory Visit: Payer: Self-pay

## 2023-12-24 DIAGNOSIS — I10 Essential (primary) hypertension: Secondary | ICD-10-CM

## 2023-12-24 MED ORDER — TELMISARTAN 40 MG PO TABS: 40.0000 mg | ORAL_TABLET | Freq: Every day | ORAL | 0 refills | Status: DC

## 2024-01-24 ENCOUNTER — Other Ambulatory Visit: Payer: Self-pay | Admitting: Obstetrics and Gynecology

## 2024-01-24 DIAGNOSIS — Z1231 Encounter for screening mammogram for malignant neoplasm of breast: Secondary | ICD-10-CM

## 2024-02-04 ENCOUNTER — Other Ambulatory Visit: Payer: Self-pay

## 2024-02-04 DIAGNOSIS — I10 Essential (primary) hypertension: Secondary | ICD-10-CM

## 2024-02-04 MED ORDER — ATORVASTATIN CALCIUM 80 MG PO TABS: 80.0000 mg | ORAL_TABLET | Freq: Every day | ORAL | 0 refills | Status: DC

## 2024-02-04 MED ORDER — TELMISARTAN 40 MG PO TABS: 40.0000 mg | ORAL_TABLET | Freq: Every day | ORAL | 0 refills | Status: DC

## 2024-02-04 MED ORDER — EZETIMIBE 10 MG PO TABS: 10.0000 mg | ORAL_TABLET | Freq: Every day | ORAL | 0 refills | Status: DC

## 2024-02-07 ENCOUNTER — Other Ambulatory Visit: Payer: Self-pay | Admitting: Internal Medicine

## 2024-02-13 ENCOUNTER — Ambulatory Visit
Admission: RE | Admit: 2024-02-13 | Discharge: 2024-02-13 | Disposition: A | Discharge status: Home / Self Care | Source: Ambulatory Visit | Attending: Obstetrics and Gynecology | Admitting: Obstetrics and Gynecology

## 2024-02-13 DIAGNOSIS — Z1231 Encounter for screening mammogram for malignant neoplasm of breast: Secondary | ICD-10-CM

## 2024-02-20 ENCOUNTER — Telehealth: Payer: Self-pay | Admitting: Internal Medicine

## 2024-02-20 NOTE — Telephone Encounter (Signed)
*  STAT* If patient is at the pharmacy, call can be transferred to refill team.   1. Which medications need to be refilled? (please list name of each medication and dose if known)   ezetimibe (ZETIA) 10 MG tablet    2. Which pharmacy/location (including street and city if local pharmacy) is medication to be sent to?   [id number] - [place], [place] - [address] AT [facility] [address]    3. Do they need a 30 day or 90 day supply? 90

## 2024-02-20 NOTE — Telephone Encounter (Signed)
 Pt of Dr. Maximo Spar. She is passed her 3rd attempt. Does Dr. Maximo Spar want to refill? Please advise.

## 2024-02-21 MED ORDER — EZETIMIBE 10 MG PO TABS: 10.0000 mg | ORAL_TABLET | Freq: Every day | ORAL | 0 refills | Status: DC

## 2024-02-21 NOTE — Telephone Encounter (Signed)
 RX sent to requested Pharmacy

## 2024-03-13 ENCOUNTER — Other Ambulatory Visit: Payer: Self-pay

## 2024-03-13 MED ORDER — EZETIMIBE 10 MG PO TABS: 10.0000 mg | ORAL_TABLET | Freq: Every day | ORAL | 1 refills | Status: DC

## 2024-05-07 ENCOUNTER — Ambulatory Visit: Attending: Internal Medicine | Admitting: Internal Medicine

## 2024-05-07 ENCOUNTER — Telehealth: Payer: Self-pay | Admitting: *Deleted

## 2024-05-07 VITALS — BP 110/76 | HR 64 | Ht 65.0 in | Wt 147.0 lb

## 2024-05-07 DIAGNOSIS — Z8249 Family history of ischemic heart disease and other diseases of the circulatory system: Secondary | ICD-10-CM | POA: Diagnosis not present

## 2024-05-07 DIAGNOSIS — I2510 Atherosclerotic heart disease of native coronary artery without angina pectoris: Secondary | ICD-10-CM

## 2024-05-07 DIAGNOSIS — E7841 Elevated Lipoprotein(a): Secondary | ICD-10-CM

## 2024-05-07 DIAGNOSIS — I10 Essential (primary) hypertension: Secondary | ICD-10-CM

## 2024-05-07 DIAGNOSIS — E785 Hyperlipidemia, unspecified: Secondary | ICD-10-CM

## 2024-05-07 MED ORDER — TELMISARTAN 20 MG PO TABS: 20.0000 mg | ORAL_TABLET | Freq: Every day | ORAL | 2 refills | Status: AC

## 2024-05-07 MED ORDER — TELMISARTAN 20 MG PO TABS: 20.0000 mg | ORAL_TABLET | Freq: Every day | ORAL | 3 refills | Status: AC

## 2024-05-07 MED ORDER — REPATHA SURECLICK 140 MG/ML ~~LOC~~ SOAJ: 140.0000 mg | SUBCUTANEOUS | 3 refills | Status: DC

## 2024-05-07 NOTE — Progress Notes (Signed)
 OFFICE NOTE  Chief Complaint:  Follow-up  Primary Care Physician: Shepperson, Kirstin, PA-C  HPI:  Shannon Gallagher is a 63 year old female with a history of depression, family history of coronary disease, and an abnormal [place] study showing a KIF6 positive genotype, as well as a hyperhomocysteinemia. She also had an elevated lipoprotein, Lp(a), and an improved dyslipidemia on Lipitor 80 mg daily. She also takes Wellbutrin  and Prozac . She is asymptomatic; however, has gained weight, is up to 184 and laboratory work last year demonstrated total cholesterol of 196 and LDL of 116. As mentioned has significant life stressors and decided after concerns with possible muscle aches and memory issues to discontinue Lipitor. She's been off that for several months and recently had a repeat lipid profile which was significantly abnormal. Her total cholesterol was 314, triglycerides 183, HDL 58 and LDL 219. She remains asymptomatic, however is at significant risk given her abnormal cholesterol profile.  Shannon Gallagher returns today for followup. She says that this past year has been particularly difficult. Her husband, an Buyer, retail had filed for divorce. She has 2 children at [facility] state and it is a difficult time. She's been under significant amount of stress. Her diet has not been as good as it could be. She did have recent lipid profile drawn which shows a total cholesterol of 229, LDL particle number of 1410, LDL calculated 135, HDL calculated 66 and triglycerides 140. This is improved and she is currently however on full dose Crestor  40 mg daily. She says this medication does give her cramps particularly in her hamstrings at night.  Unfortunately her cholesterol is still not at goal based on her strong family history of coronary disease. As she is on maximal tolerated statin therapy, she may be a candidate for a PCSK9 inhibitor.  However, we do not have evidence that she has existing coronary  disease.  12/10/2016  Shannon Gallagher returns today for follow-up. It's been about 2-1/2 years since I saw her in the office. She returns today for follow-up and is complaining of palpitations. At the time she had filed for divorce but however this is not totally settled and there is an kenya question that remains. She is under significant stress from this. She's recently been having some more palpitations. She does see a psychiatrist, Dr. 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 She also likely has ADD and recently started Vyvanase for treatment of that. In addition, she was given some Xanax for anxiety. She reports her palpitations are worse particularly when getting bad news, communications from her ex-husband or communications from the attorney. She said they tend to be very short-lived. Sometimes Xanax actually helps improve the palpitations. The frequency of these palpitations have interestingly increased somewhat by my observation after starting on ADD medication. She denies any chest pain. As a reminder she underwent a coronary artery calcium  score in 2015 which showed 0 coronary calcium . Despite this she had somewhat of an elevated lipid profile and a strong family history of premature coronary disease in her father. She was intolerant of statins but was ultimately placed on atorvastatin  80 mg and has had a marked improvement in her lipid profile. Her recent lipid profile from 222/18 showed total cholesterol 184, HDL-C 66, LDL-C101, triglycerides 85, non-HDL cholesterol of 118. This is on therapy. According to current guidelines, one could argue given her 0 coronary calcium  score her risk is very low and that statin therapy may not  even be indicated, although her cholesterol is expected to be much higher based on her response to high potency statin therapy. I did calculate a [place] risk score. Given her traditional risk factors her 10 year risk of  CHD event is only 2%, and given her 0 calcium  score at risk lowered to 1.4% which is similar to the general population given her age.  07/04/2018  Shannon Gallagher returns today for follow-up.  Overall she seems to be doing well.  She said this last year has been tough as she had torn her meniscus in her right knee.  This required arthroscopic procedure.  She has been a little slow to recover from that.  She has had some fluctuation in her weight although she is only slightly up in weight today.  Her LDL cholesterol however has increased from 101 up to 133.  She reports compliance with atorvastatin  but says that her diet has not been necessarily is clean as it had been last year.  In addition her exercise is decreased significantly.  It is known that she has a 0 coronary artery calcium  score, however her LDL cholesterol still quite high given the fact she is on high intensity statin, suggesting the possibility of an inherited dyslipidemia.  Arguing against this however is no early onset coronary disease in the family despite the fact that there is a high number of individuals with elevated cholesterol.  Based on this I would favor continued therapy with at least 50% reduction in LDL.  08/28/2019  Shannon Gallagher returns today for follow-up.  Overall she continues to do well.  She says she is not exercising as much as she had been recently.  Her daughter is now living with her during Covid.  She has plans of going on to graduate school.  Cholesterol was recently reassessed.  It seems fairly stable total 227, triglycerides 99, HDL 72 and LDL 138.  In the past she had had lipoprotein particle testing which was in the 1400s and small LDL P was a little elevated.  She apparently also had an elevated LP(a), however I cannot find that initial test which was performed by Dr.Solomon prior to me assuming her care in 2013.  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  09/13/2020  Shannon Gallagher seen today for follow-up.   She continues to do very well.  She is contemplating switching jobs.  She is not had repeat lipid testing.  She did have a high LP(a) however at 210 which is a significant risk factor for early onset heart disease.  Unfortunately the atorvastatin  is not affecting that.  If she consider her lipids without the atorvastatin , her LDL cholesterol is probably much higher likely over 190 suggestive of familial hyperlipidemia.  There is a strong history of family heart disease.  We did discuss today the possibility of her may be being a candidate for anti-LP(a) study.  01/05/2022  Kimoni returns today for follow-up.  She recently had a bronchitis.  Back in January she said she was having some more frequent palpitations.  She noted that her blood pressure has been elevated.  When she was in urgent care was in the 150s over 100.  Today blood pressure 164/100.  She feels like this is causing her some concern and would like to have it addressed.  She reports compliance with both atorvastatin  and ezetimibe .  Her cholesterol has come down with total 188, triglycerides 110, HDL 78 and LDL 91 as of July 2022.  04/09/2022  Rubena is seen today in follow-up.  Overall she says she is doing pretty well.  Her blood pressure is much improved on telmisartan .  She had a lot more life stressors but feels like the medicine has helped her feel better.  She did have a small increase in creatinine when it was last checked about a month ago.  I like to recheck her metabolic profile again and I have encouraged hydration.  She is also due for repeat lipid profile.  05/07/2024  Simonne returns today for follow-up.  She seems to be doing well.  She is pleased recently that she has had about 30 pound weight loss on Wegovy .  She has had significant health benefits from that as well as improvement in knee pain and other things.  Her blood pressure is well-controlled today at 110/76 and actually she was able to reduce her telmisartan  from 40 to 20 mg  daily.  We did retest her coronary calcium  score in 2023 which was elevated at 75.7, 89th percentile.  This was previously 0 in 2015.  She is known to have an elevated LP(a).  She has been on high-dose statin and ezetimibe  with recent lipids through her PCP at [facility] on April 27, 2024 showing total cholesterol 196, triglycerides 64, HDL 80 and LDL 104.  I would like to see her LDL lower below 70.  PMHx:  Past Medical History:  Diagnosis Date   Anxiety    Complication of anesthesia    Hard to wake up   Depression    Dyslipidemia    Positive KIF6 genotype.  Elevated LP(a)   GERD (gastroesophageal reflux disease)    Hyperhomocysteinemia (HCC)    Hypertension     Past Surgical History:  Procedure Laterality Date   2D Echo  05/30/2007   EF > 55%, Trace MR.    AUGMENTATION MAMMAPLASTY Bilateral 11/21/2020   CARPAL TUNNEL RELEASE Right    CARPAL TUNNEL RELEASE Left    Exercise stress test  07/17/2007   Work load 15 METS.  No CP or EKG changes.   MENISCUS REPAIR Left    MENISCUS REPAIR Right    REDUCTION MAMMAPLASTY     WOUND EXPLORATION Left 08/28/2023   Procedure: WOUND EXPLORATION, ULNAR NERVE;  Surgeon: Lanis Pupa, MD;  Location: [facility] OR;  Service: Neurosurgery;  Laterality: Left;    FAMHx:  Family History  Problem Relation Age of Onset   Breast cancer Mother 46   BRCA 1/2 Neg Hx     SOCHx:   reports that she has never smoked. She has never used smokeless tobacco. She reports current alcohol use of about 14.0 standard drinks of alcohol per week. She reports that she does not use drugs.  ALLERGIES:  Allergies  Allergen Reactions   Tape Itching    Paper tape okay    ROS: Pertinent items noted in HPI and remainder of comprehensive ROS otherwise negative.  HOME MEDS: Current Outpatient Medications  Medication Sig Dispense Refill   atorvastatin  (LIPITOR) 80 MG tablet Take 1 tablet (80 mg total) by mouth daily. 15 tablet 0   buPROPion  (WELLBUTRIN  XL) 150 MG 24 hr  tablet Take 150 mg by mouth daily.     DUPIXENT 300 MG/2ML SOAJ AS DIRECTED SUBCUTANEOUSLY PER WEEK for 28 DAYS     Estradiol 10 MCG TABS vaginal tablet Place 10 mcg vaginally.     EUFLEXXA 20 MG/2ML SOSY Inject 2 mLs into the skin every 6 (six) months.  Evolocumab  (REPATHA  SURECLICK) 140 MG/ML SOAJ Inject 140 mg into the skin every 14 (fourteen) days. 6 mL 3   ezetimibe  (ZETIA ) 10 MG tablet Take 1 tablet (10 mg total) by mouth daily. 30 tablet 1   FLUoxetine  (PROZAC ) 40 MG capsule Take 1 capsule by mouth daily.     LORazepam  (ATIVAN ) 1 MG tablet Take 1 mg by mouth daily as needed for anxiety.     pantoprazole  (PROTONIX ) 40 MG tablet Take 40 mg by mouth daily.     telmisartan  (MICARDIS ) 20 MG tablet Take 1 tablet (20 mg total) by mouth daily. 30 tablet 2   telmisartan  (MICARDIS ) 20 MG tablet Take 1 tablet (20 mg total) by mouth daily. 90 tablet 3   Vitamin D, Ergocalciferol, (DRISDOL) 1.25 MG (50000 UNIT) CAPS capsule Take 50,000 Units by mouth.     VYVANSE  70 MG capsule Take 1 capsule by mouth daily.  0   WEGOVY  1.7 MG/0.75ML SOAJ SMARTSIG:1.7 Milligram(s) SUB-Q Once a Week     cetirizine  (ZYRTEC  ALLERGY) 10 MG tablet Take 1 tablet (10 mg total) by mouth at bedtime. 30 tablet 2   cyclobenzaprine  (FLEXERIL ) 5 MG tablet Take 5 mg by mouth daily as needed for muscle spasms.     fluticasone  (FLOVENT HFA) 110 MCG/ACT inhaler Inhale 1 puff into the lungs daily as needed (for shortness of breath).     gabapentin  (NEURONTIN ) 300 MG capsule Take 300 mg by mouth at bedtime as needed (for pain).     HYDROcodone -acetaminophen  (NORCO/VICODIN) 5-325 MG tablet Take 1 tablet by mouth every 6 (six) hours as needed for moderate pain (pain score 4-6).     No current facility-administered medications for this visit.    LABS/IMAGING: No results found for this or any previous visit (from the past 48 hours). No results found.  VITALS: BP 110/76 (BP Location: Left Arm, Patient Position: Sitting, Cuff Size:  Normal)   Pulse 64   Ht 5' 5 (1.651 m)   Wt 147 lb (66.7 kg)   SpO2 95%   BMI 24.46 kg/m   EXAM: General appearance: alert and no distress Lungs: clear to auscultation bilaterally Heart: regular rate and rhythm, S1, S2 normal, no murmur, click, rub or gallop Extremities: extremities normal, atraumatic, no cyanosis or edema Neurologic: Grossly normal  EKG: EKG Interpretation Date/Time:  Thursday May 07 2024 08:37:28 EDT Ventricular Rate:  64 PR Interval:  150 QRS Duration:  80 QT Interval:  382 QTC Calculation: 394 R Axis:   38  Text Interpretation: Normal sinus rhythm Normal ECG When compared with ECG of 05-Mar-2023 08:53, No significant change was found Confirmed by Mona Kent [phone]) on 05/07/2024 8:45:08 AM    ASSESSMENT: Hypertension History of palpitations Dyslipidemia goal LDL <70 CAC 75.7, 89th percentile (2023) History of depression with significant life stressors Strong family history of coronary disease High LP(a) at 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   Mrs. Maurer seems to be doing well with regards to recent substantial weight loss on Wegovy  and improvement in blood pressure and lipid profiles.  LDL however remains above target less than 70 possibly due to a high LP(a).  She is already on high-dose statin and ezetimibe  with a target LDL less than 70 given coronary artery calcification which was noted in 2023 that is age advanced.  I think she is a good candidate to add Repatha .  Will reach out for prior authorization for this.  She would like a 30-day prescription to her [facility] on [address] and then  ultimately sent to [facility].  We will also refill her telmisartan  which is currently 20 mg daily.  Plan repeat lipids including NMR and LP(a) in about 3 months.  Follow-up with me annually or sooner as necessary.  Vinie KYM Maxcy, MD, [facility], FNLA, FACP  [place]  [facility]  Medical Director of the Advanced Lipid Disorders &  Cardiovascular Risk Reduction  Clinic Diplomate of the American Board of Clinical Lipidology Attending Cardiologist  Direct Dial: [phone]  Fax: [phone]  Website:  [email or web address] 05/07/2024, 9:37 AM

## 2024-05-07 NOTE — Telephone Encounter (Signed)
 Dr Mona would like to start Repatha . Can you please start P.A.  ( Sending to prior auth team  to start process)

## 2024-05-07 NOTE — Patient Instructions (Addendum)
 Medication Instructions:     Start Repatha   take injection every 2 weeks - once we received approval from your insurance    Telmisartan  20 mg daily    *If you need a refill on your cardiac medications before your next appointment, please call your pharmacy*   Lab Work: 3 months after starting Repatha  LIPID NMR Liprofile  LPa If you have labs (blood work) drawn today and your tests are completely normal, you will receive your results only by: MyChart Message (if you have MyChart) OR A paper copy in the mail If you have any lab test that is abnormal or we need to change your treatment, we will call you to review the results.   Testing/Procedures:  Not needed  Follow-Up: At [facility], you and your health needs are our priority.  As part of our continuing mission to provide you with exceptional heart care, we have created designated Provider Care Teams.  These Care Teams include your primary Cardiologist (physician) and Advanced Practice Providers (APPs -  Physician Assistants and Nurse Practitioners) who all work together to provide you with the care you need, when you need it.     Your next appointment:   12 month(s)  The format for your next appointment:   In Person  Provider:   Vinie JAYSON Maxcy, MD   Other Instructions  .

## 2024-05-21 ENCOUNTER — Telehealth: Payer: Self-pay | Admitting: Pharmacy Technician

## 2024-05-21 ENCOUNTER — Encounter: Payer: Self-pay | Admitting: Internal Medicine

## 2024-05-21 ENCOUNTER — Other Ambulatory Visit (HOSPITAL_COMMUNITY): Payer: Self-pay

## 2024-05-21 NOTE — Telephone Encounter (Signed)
 Per test claim repatha  is 50.00 for 1 month    Gave to [facility]

## 2024-05-29 MED ORDER — REPATHA SURECLICK 140 MG/ML ~~LOC~~ SOAJ: 140.0000 mg | SUBCUTANEOUS | 3 refills | Status: DC

## 2024-06-17 MED ORDER — REPATHA SURECLICK 140 MG/ML ~~LOC~~ SOAJ
140.0000 mg | SUBCUTANEOUS | 3 refills | Status: AC
Start: 2024-06-17 — End: ?

## 2024-06-17 NOTE — Addendum Note (Signed)
 Addended by: LORING ANDRIETTE HERO on: 06/17/2024 12:29 PM   Modules accepted: Orders

## 2024-07-08 ENCOUNTER — Other Ambulatory Visit: Payer: Self-pay | Admitting: Internal Medicine

## 2024-07-08 MED ORDER — EZETIMIBE 10 MG PO TABS: 10.0000 mg | ORAL_TABLET | Freq: Every day | ORAL | 3 refills | Status: DC

## 2024-07-08 NOTE — Addendum Note (Signed)
 Addended by: Alyha Marines W on: 07/08/2024 09:46 AM   Modules accepted: Orders

## 2024-07-10 ENCOUNTER — Other Ambulatory Visit (HOSPITAL_COMMUNITY): Payer: Self-pay

## 2024-08-12 ENCOUNTER — Other Ambulatory Visit: Payer: Self-pay | Admitting: Internal Medicine

## 2024-08-14 ENCOUNTER — Other Ambulatory Visit: Payer: Self-pay | Admitting: Internal Medicine

## 2024-08-14 MED ORDER — EZETIMIBE 10 MG PO TABS: 10.0000 mg | ORAL_TABLET | Freq: Every day | ORAL | 3 refills | Status: AC

## 2024-10-26 ENCOUNTER — Encounter: Payer: Self-pay | Admitting: Internal Medicine
